# Patient Record
Sex: Female | Born: 1965
Health system: Southern US, Community
[De-identification: ages and names within clinical notes are randomized; demographics above are authoritative.]

## PROBLEM LIST (undated history)

## (undated) DIAGNOSIS — D649 Anemia, unspecified: Secondary | ICD-10-CM

## (undated) DIAGNOSIS — J479 Bronchiectasis, uncomplicated: Secondary | ICD-10-CM

## (undated) DIAGNOSIS — J45909 Unspecified asthma, uncomplicated: Secondary | ICD-10-CM

## (undated) HISTORY — DX: Unspecified asthma, uncomplicated: J45.909

## (undated) HISTORY — DX: Bronchiectasis, uncomplicated: J47.9

## (undated) HISTORY — DX: Anemia, unspecified: D64.9

---

## 2008-04-10 ENCOUNTER — Ambulatory Visit (HOSPITAL_COMMUNITY): Admission: RE | Admit: 2008-04-10 | Discharge: 2008-04-10 | Payer: Self-pay | Admitting: Family Medicine

## 2009-05-25 ENCOUNTER — Ambulatory Visit (HOSPITAL_COMMUNITY): Admission: RE | Admit: 2009-05-25 | Discharge: 2009-05-25 | Payer: Self-pay | Admitting: Family Medicine

## 2013-01-05 ENCOUNTER — Other Ambulatory Visit (HOSPITAL_COMMUNITY): Payer: Self-pay | Admitting: Obstetrics & Gynecology

## 2013-01-05 DIAGNOSIS — Z1231 Encounter for screening mammogram for malignant neoplasm of breast: Secondary | ICD-10-CM

## 2013-01-10 ENCOUNTER — Ambulatory Visit (HOSPITAL_COMMUNITY)
Admission: RE | Admit: 2013-01-10 | Discharge: 2013-01-10 | Disposition: A | Discharge status: Home / Self Care | Payer: Medicaid Other | Source: Ambulatory Visit | Attending: Obstetrics & Gynecology | Admitting: Obstetrics & Gynecology

## 2013-01-10 DIAGNOSIS — Z1231 Encounter for screening mammogram for malignant neoplasm of breast: Secondary | ICD-10-CM | POA: Insufficient documentation

## 2013-01-11 ENCOUNTER — Other Ambulatory Visit: Payer: Self-pay | Admitting: Obstetrics & Gynecology

## 2013-01-11 DIAGNOSIS — R928 Other abnormal and inconclusive findings on diagnostic imaging of breast: Secondary | ICD-10-CM

## 2013-02-02 ENCOUNTER — Ambulatory Visit
Admission: RE | Admit: 2013-02-02 | Discharge: 2013-02-02 | Disposition: A | Discharge status: Home / Self Care | Payer: Medicaid Other | Source: Ambulatory Visit | Attending: Obstetrics & Gynecology | Admitting: Obstetrics & Gynecology

## 2013-02-02 DIAGNOSIS — R928 Other abnormal and inconclusive findings on diagnostic imaging of breast: Secondary | ICD-10-CM

## 2014-02-23 ENCOUNTER — Other Ambulatory Visit: Payer: Self-pay | Admitting: Obstetrics & Gynecology

## 2014-02-23 ENCOUNTER — Encounter: Payer: Self-pay | Admitting: Obstetrics & Gynecology

## 2014-02-23 DIAGNOSIS — R921 Mammographic calcification found on diagnostic imaging of breast: Secondary | ICD-10-CM

## 2014-02-23 DIAGNOSIS — N6452 Nipple discharge: Secondary | ICD-10-CM

## 2014-03-06 ENCOUNTER — Ambulatory Visit
Admission: RE | Admit: 2014-03-06 | Discharge: 2014-03-06 | Disposition: A | Discharge status: Home / Self Care | Payer: Medicaid Other | Source: Ambulatory Visit | Attending: Obstetrics & Gynecology | Admitting: Obstetrics & Gynecology

## 2014-03-06 ENCOUNTER — Other Ambulatory Visit: Payer: Self-pay | Admitting: Obstetrics & Gynecology

## 2014-03-06 DIAGNOSIS — N6452 Nipple discharge: Secondary | ICD-10-CM

## 2014-03-06 DIAGNOSIS — R921 Mammographic calcification found on diagnostic imaging of breast: Secondary | ICD-10-CM

## 2014-03-20 ENCOUNTER — Ambulatory Visit
Admission: RE | Admit: 2014-03-20 | Discharge: 2014-03-20 | Disposition: A | Discharge status: Home / Self Care | Payer: Medicaid Other | Source: Ambulatory Visit | Attending: Obstetrics & Gynecology | Admitting: Obstetrics & Gynecology

## 2014-03-20 ENCOUNTER — Encounter (INDEPENDENT_AMBULATORY_CARE_PROVIDER_SITE_OTHER): Payer: Self-pay

## 2014-03-20 DIAGNOSIS — N6452 Nipple discharge: Secondary | ICD-10-CM

## 2014-03-20 DIAGNOSIS — R921 Mammographic calcification found on diagnostic imaging of breast: Secondary | ICD-10-CM

## 2014-03-30 ENCOUNTER — Ambulatory Visit (INDEPENDENT_AMBULATORY_CARE_PROVIDER_SITE_OTHER): Payer: Medicaid Other | Admitting: General Surgery

## 2014-05-09 ENCOUNTER — Ambulatory Visit (INDEPENDENT_AMBULATORY_CARE_PROVIDER_SITE_OTHER): Payer: Medicaid Other | Admitting: General Surgery

## 2014-05-09 ENCOUNTER — Telehealth (INDEPENDENT_AMBULATORY_CARE_PROVIDER_SITE_OTHER): Payer: Self-pay

## 2014-05-09 NOTE — Telephone Encounter (Signed)
Please call ask for glenda to reschedule with Dr. Derrell LollingIngram asap

## 2014-05-29 ENCOUNTER — Ambulatory Visit (INDEPENDENT_AMBULATORY_CARE_PROVIDER_SITE_OTHER): Payer: Self-pay | Admitting: General Surgery

## 2014-11-06 ENCOUNTER — Encounter: Payer: Self-pay | Admitting: *Deleted

## 2014-11-07 ENCOUNTER — Encounter: Payer: Self-pay | Admitting: Obstetrics & Gynecology

## 2018-04-20 ENCOUNTER — Ambulatory Visit (INDEPENDENT_AMBULATORY_CARE_PROVIDER_SITE_OTHER): Payer: BLUE CROSS/BLUE SHIELD

## 2018-04-20 ENCOUNTER — Telehealth: Payer: Self-pay | Admitting: *Deleted

## 2018-04-20 ENCOUNTER — Encounter: Payer: Self-pay | Admitting: Emergency Medicine

## 2018-04-20 ENCOUNTER — Ambulatory Visit: Payer: BLUE CROSS/BLUE SHIELD | Admitting: Emergency Medicine

## 2018-04-20 VITALS — BP 102/67 | HR 84 | Temp 98.5°F | Resp 17 | Ht 62.0 in | Wt 169.0 lb

## 2018-04-20 DIAGNOSIS — R05 Cough: Secondary | ICD-10-CM | POA: Diagnosis not present

## 2018-04-20 DIAGNOSIS — R059 Cough, unspecified: Secondary | ICD-10-CM

## 2018-04-20 DIAGNOSIS — J189 Pneumonia, unspecified organism: Secondary | ICD-10-CM | POA: Insufficient documentation

## 2018-04-20 DIAGNOSIS — J181 Lobar pneumonia, unspecified organism: Secondary | ICD-10-CM

## 2018-04-20 MED ORDER — PREDNISONE 20 MG PO TABS: 40.0000 mg | ORAL_TABLET | Freq: Every day | ORAL | 0 refills | Status: AC

## 2018-04-20 MED ORDER — AZITHROMYCIN 250 MG PO TABS: ORAL_TABLET | ORAL | 0 refills | Status: DC

## 2018-04-20 MED ORDER — CEFTRIAXONE SODIUM 1 G IJ SOLR
1.0000 g | Freq: Once | INTRAMUSCULAR | Status: AC
  Administered 2018-04-20: 1 g via INTRAMUSCULAR

## 2018-04-20 NOTE — Progress Notes (Signed)
Tamekia T Laidlaw 52 y.o.   Chief Complaint  Patient presents with  . Sore Throat  . Cough    HISTORY OF PRESENT ILLNESS: This is a 52 y.o. female complaining of productive cough for 5 days along with sore throat and chest congestion.  Denies fever or chills.  Denies difficulty breathing.  HPI   Prior to Admission medications   Not on File    No Known Allergies  There are no active problems to display for this patient.   No past medical history on file.    Social History   Socioeconomic History  . Marital status: Single    Spouse name: Not on file  . Number of children: Not on file  . Years of education: Not on file  . Highest education level: Not on file  Occupational History  . Not on file  Social Needs  . Financial resource strain: Not on file  . Food insecurity:    Worry: Not on file    Inability: Not on file  . Transportation needs:    Medical: Not on file    Non-medical: Not on file  Tobacco Use  . Smoking status: Never Smoker  . Smokeless tobacco: Never Used  Substance and Sexual Activity  . Alcohol use: Not on file  . Drug use: Not on file  . Sexual activity: Not on file  Lifestyle  . Physical activity:    Days per week: Not on file    Minutes per session: Not on file  . Stress: Not on file  Relationships  . Social connections:    Talks on phone: Not on file    Gets together: Not on file    Attends religious service: Not on file    Active member of club or organization: Not on file    Attends meetings of clubs or organizations: Not on file    Relationship status: Not on file  . Intimate partner violence:    Fear of current or ex partner: Not on file    Emotionally abused: Not on file    Physically abused: Not on file    Forced sexual activity: Not on file  Other Topics Concern  . Not on file  Social History Narrative  . Not on file    No family history on file.   Review of Systems  Constitutional: Negative.  Negative for chills  and fever.  HENT: Negative.  Negative for congestion, hearing loss, nosebleeds and sore throat.   Eyes: Negative.  Negative for blurred vision and double vision.  Respiratory: Positive for cough and sputum production. Negative for hemoptysis and shortness of breath.   Cardiovascular: Negative.  Negative for chest pain, palpitations and leg swelling.  Gastrointestinal: Negative.  Negative for abdominal pain, diarrhea, nausea and vomiting.  Genitourinary: Negative.   Musculoskeletal: Negative.  Negative for back pain, myalgias and neck pain.  Skin: Negative.  Negative for rash.  Neurological: Negative.  Negative for dizziness and headaches.  Endo/Heme/Allergies: Negative.   All other systems reviewed and are negative.   Vitals:   04/20/18 1556  BP: 102/67  Pulse: 84  Resp: 17  Temp: 98.5 F (36.9 C)  SpO2: 98%    Physical Exam  Constitutional: She is oriented to person, place, and time. She appears well-developed and well-nourished.  HENT:  Head: Normocephalic and atraumatic.  Nose: Nose normal.  Mouth/Throat: Oropharynx is clear and moist.  Eyes: Pupils are equal, round, and reactive to light. Conjunctivae and EOM are normal.  Neck: Normal range of motion. Neck supple. No JVD present.  Cardiovascular: Normal rate, regular rhythm and normal heart sounds.  Pulmonary/Chest: Effort normal. No respiratory distress. She has wheezes in the right lower field, the left middle field and the left lower field. She has rhonchi in the left middle field and the left lower field. She has rales in the left lower field.      Musculoskeletal: Normal range of motion.  Lymphadenopathy:    She has no cervical adenopathy.  Neurological: She is alert and oriented to person, place, and time. No sensory deficit. She exhibits normal muscle tone.  Skin: Skin is warm and dry. Capillary refill takes less than 2 seconds.  Psychiatric: She has a normal mood and affect. Her behavior is normal.  Vitals  reviewed. Dg Chest 2 View  Result Date: 04/20/2018 CLINICAL DATA:  Suspected pneumonia. EXAM: CHEST - 2 VIEW COMPARISON:  CT chest 12/09/2015 FINDINGS: Bilateral lower lobe bronchiectasis. Increased airspace disease at bilateral lung bases concerning for pneumonia. No pleural effusion or pneumothorax. Stable cardiomediastinal silhouette. No acute osseous abnormality. IMPRESSION: Bilateral lower lobe bronchiectasis. Increased airspace disease at bilateral lung bases concerning for pneumonia. Electronically Signed   By: Elige KoHetal  Patel   On: 04/20/2018 16:46      A total of 30 minutes was spent in the room with the patient, greater than 50% of which was in counseling/coordination of care regarding differential diagnosis, management, treatment, medications and need for follow-up in 2 3 days.   ASSESSMENT & PLAN: Jessye was seen today for sore throat and cough.  Diagnoses and all orders for this visit:  Pneumonia of both lower lobes due to infectious organism (HCC) -     cefTRIAXone (ROCEPHIN) injection 1 g -     azithromycin (ZITHROMAX) 250 MG tablet; Sig as indicated -     predniSONE (DELTASONE) 20 MG tablet; Take 2 tablets (40 mg total) by mouth daily with breakfast for 5 days.  Cough -     DG Chest 2 View; Future -     predniSONE (DELTASONE) 20 MG tablet; Take 2 tablets (40 mg total) by mouth daily with breakfast for 5 days.    Patient Instructions       IF you received an x-ray today, you will receive an invoice from [facility]. Please contact [facility] at [phone] with questions or concerns regarding your invoice.   IF you received labwork today, you will receive an invoice from [facility]. Please contact [facility] at [phone] with questions or concerns regarding your invoice.   Our billing staff will not be able to assist you with questions regarding bills from these companies.  You will be contacted with the lab results as soon as they are available.  The fastest way to get your results is to activate your My Chart account. Instructions are located on the last page of this paperwork. If you have not heard from us regarding the results in 2 weeks, please contact this office.      Vim ph?i c?ng ??ng, Ng??i l?n. Community-Acquired Pneumonia, Adult Vim ph?i l b?nh nhi?m trng ph?i. C nh?ng lo?i vim ph?i khc nhau. M?t lo?i c th? pht tri?n khi m?t ng??i ph?i n?m vi?n. M?t lo?i khc, g?i l vim ph?i c?ng ??ng, pht tri?n ? nh?ng ng??i khng, ho?c m?i ?y ch?a, ph?i n?m vi?n ho?c n?m ? c? s? ch?m [place] s?c kh?e khc. Nguyn nhn g gy ra? Vim ph?i c th? do vi khu?n, vi rt ho?c n?m  gy ra. Vim ph?i c?ng ??ng th??ng do vi khu?n gy ra Vim ph?i khu?n lin c?u. Nh?ng vi khu?n ny th??ng t? ng??i ny truy?n sang ng??i khc thng qua vi?c ht ph?i nh?ng gi?t n??c nh? do ng??i nhi?m b?nh ho ho?c h?t h?i. ?i?u g lm t?ng nguy c?? Tnh tr?ng ny hay x?y ra h?n ?:  Nh?ng ng??i b?cc b?nh m?n tnh, ch?ng h?n nh? b?nh ph?i t?c ngh?n m?n tnh (COPD), hen suy?n, suy tim sung huy?t, x? nang, ti?u ???ng, ho?c b?nh th?n.  Nh?ng ng??i m?cHIV giai ?o?n s?m ho?c giai ?o?n mu?n.  Nh?ng ng??i b?b?nh h?ng c?u hnh li?m.  Nh?ng ng??i? b? c?t lch (ph?u thu?t c?t lch).  Nh?ng ng??i cv? sinh r?ng mi?ng km.  Nh?ng ng??i ccc tnh tr?ng b?nh l lm t?ng nguy c? ht ph?i(ht ph?i) ch?t bi ti?t t? chnh mi?ng v m?i c?a h?.  Nh?ng ng??i ch? mi?n d?ch y?u (suy gi?m mi?n d?ch).  Nh?ng ng??i ht thu?c.  Nh?ng ng??i?i ??n nh?ng khu v?c m vi trng gy vim ph?i th??ng t?n t?i.  Nh?ng ng??i? bn c?nh nh?ng loi ??ng v?t c th? c vi trng gy vim ph?i, bao g?m chim, d?i, th?, mo v cc gia [place].  Cc d?u hi?u ho?c tri?u ch?ng l g? Nh?ng tri?u ch?ng c?a tnh tr?ng ny bao g?m:  [place].  Ho ??t (ho c ??m).  S?t.  ?? m? hi.  ?au ng?c, ??c bi?t khi th? su ho?c ho.  Th? nhanh ho?c kh th?.  Kh th?.  Rng mnh v  l?nh.  M?t m?i.  ?au nh?c c?.  Ch?n ?on tnh tr?ng ny nh? th? no? Chuyn gia ch?m [place] s?c kh?e c?a qu v? s? h?i v? b?nh s? v khm th?c th? cho qu v?. Qu v? c?ng c th? ph?i lm cc ki?m tra khc, bao g?m:  Ki?m tra ng?c b?ng hnh ?nh, bao g?m ch?p [place].  Nh?ng xt nghi?m ?? ki?m tra n?ng ??  xi trong mu v cc kh mu khc.  Nh?ng xt nghi?m khc v?i mu, d?ch nh?y (??m), ch?t d?ch quanh ph?i (d?ch mng ph?i), v n??c ti?u.  N?u vim ph?i n?ng, c th? c?n lm nh?ng xt nghi?m khc ?? tm nguyn nhn c? th? gy b?nh. Tnh tr?ng ny ???c ?i?u tr? nh? th? no? Lo?i ?i?u tr? dnh cho qu v? ty thu?c vo nhi?u nhn t?, ch?ng h?n nh? nguyn nhn gy vim ph?i, nh?ng lo?i thu?c qu v? dng v nh?ng tnh tr?ng b?nh l khc c?a qu v?. ??i v?i h?u h?t ng??i l?n, vi?c ?i?u tr? v ph?c h?i b?nh vim ph?i c th? di?n ra ? nh. Trong m?t s? tr??ng h?p c?n ph?i ?i?u tr? trong b?nh vi?n. ?i?u tr? c th? bao g?m:  Thu?c khng sinh, n?u vim ph?i do vi khu?n gy ra.  Thu?c ch?ng vi rt, n?u vim ph?i do vi rt gy ra.  Thu?c dng qua ???ng mi?ng ho?c qua ???ng truy?n t?nh m?ch.   xi.  Li?u php h h?p.  M?c d hi?m g?p, vi?c ?i?u tr? vim ph?i n?ng c th? bao g?m:  Thng kh c? h?c. Vi?c ny ???c th?c hi?n n?u qu v? khng t? th? ???c t?t v qu v? khng th? duy tr n?ng ??  xi an ton trong mu.  Ch?c d mng ph?i. Th? thu?t nylo?i b? d?ch ? quanh m?t bn ph?i ho?c c? hai bn ?? gip qu v? th? t?t h?n.  Tun th? nh?ng h??ng d?n ny ?  nh:  Ch? s? d?ng thu?c khng k ??n v thu?c k ??n theo ch? d?n c?a chuyn gia ch?m [place] s?c kh?e. ? Ch? dngthu?c ho n?u qu v? b? m?t ng?. Hi?u r?ng thu?c ho c th? ng?n c?n kh? n?ng lo?i b? d?ch theo cch t? nhin c?a c? th? qu v? ra kh?i ph?i. ? N?u qu v? ???c k thu?c khng sinh, hy dng thu?c theo ch? d?n c?a chuyn gia ch?m [place] s?c kh?e. Khng d?ng u?ng thu?c khng sinh ngay c? khi qu v? b?t ??u c?m th?y ?? h?n.  Ng? ? t? th? n?a  n?m n?a ng?i vo ban ?m. Th? ng? trn m?t chi?c gh? t?a, ho?c ??t vi ci g?i d??i ??u.  Khng s? d?ng cc s?n ph?m thu?c l, bao g?m thu?c l d?ng ht, thu?c l d?ng nhai v thu?c l ?i?n t?. N?u qu v? c?n gip ?? ?? cai thu?c, hy h?i chuyn gia ch?m [place] s?c kh?e.  U?ng ?? n??c ?? n??c ti?u trong ho?c c mu vng nh?t. Vi?c ny s? gip lm l?ng d?ch nh?y trong ph?i c?a qu v?. Ng?n ng?a tnh tr?ng ny b?ng cch no? C nh?ng cch m qu v? c th? gi?m nguy c? b? b?nh vim ph?i c?ng ??ng. Hy xem xt vi?c tim v?c xin ph? c?u khu?n n?u:  Qu v? trn 66 tu?i.  Qu v? h?n 19 tu?i v ?ang ?i?u tr? ung th?, c b?nh ph?i m?n tnh, ho?c c nh?ng b?nh l khc ?nh h??ng ??n h? mi?n d?ch. Hy h?i chuyn gia ch?m [place] s?c kh?e xem vi?c ny c p d?ng cho qu v? hay khng.  C nh?ng lo?i v?c xin khc nhau v cc l?ch trnh tim v?c xin ph? c?u khu?n khc nhau. Hy h?i chuyn gia ch?m [place] s?c kh?e c?a qu v? xem ph??ng n tim v?c xin no l t?t nh?t cho qu v?. Qu v? c?ng c th? ng?n ng?a vi?c b? vim ph?i c?ng ??ng n?u qu v? th?c hi?n nh?ng hnh ??ng sau:  Tim v?c xin cm hng n?m. Hy h?i chuyn gia ch?m [place] s?c kh?e c?a qu v? xem lo?i v?c xin cm no l t?t nh?t cho qu v?.  Th??ng xuyn ??n g?p nha s?.  R?a tay th??ng xuyn. S? d?ng thu?c r?a tay n?u khng c x phng v n??c.  Hy lin l?c v?i chuyn gia ch?m [place] s?c kh?e n?u:  Qu v? b? s?t.  Qu v? b? m?t ng? v qu v? khng th? ki?m sot c?n ho b?ng thu?c ho. Yu c?u tr? gip ngay l?p t?c n?u:  Qu v? b? kh th? h?n.  Qu v? b? ?au ng?c nhi?u h?n.  B?nh c?a qu v? tr? nn tr?m tr?ng h?n, ??c bi?t n?u qu v? l ng??i cao tu?i ho?c c h? mi?n d?ch y?u.  Qu v? ho ra mu. Thng tin ny khng nh?m m?c ?ch thay th? cho l?i khuyn m chuyn gia ch?m [place] s?c kh?e ni v?i qu v?. Hy b?o ??m qu v? ph?i th?o lu?n b?t k? v?n ?? g m qu v? c v?i chuyn gia ch?m  s?c kh?e c?a qu v?. Document Released: 10/27/2005 Document Revised:  02/09/2017 Document Reviewed: 02/21/2015 Elsevier Interactive Patient Education  2018 Elsevier Inc.      Edwina Barth, MD [facility]

## 2018-04-20 NOTE — Telephone Encounter (Signed)
Received call from Lumin at the [facility], the patient's pharmacy does not have Prednisone 20 mg is it okay to dispense 10 mg dosage. I told Dr Alvy BimlerSagardia and he ok 'd it.

## 2018-04-20 NOTE — Patient Instructions (Addendum)
IF you received an x-ray today, you will receive an invoice from [facility]. Please contact [facility] at [phone] with questions or concerns regarding your invoice.   IF you received labwork today, you will receive an invoice from [facility]. Please contact [facility] at [phone] with questions or concerns regarding your invoice.   Our billing staff will not be able to assist you with questions regarding bills from these companies.  You will be contacted with the lab results as soon as they are available. The fastest way to get your results is to activate your My Chart account. Instructions are located on the last page of this paperwork. If you have not heard from us regarding the results in 2 weeks, please contact this office.      Vim ph?i c?ng ??ng, Ng??i l?n. Community-Acquired Pneumonia, Adult Vim ph?i l b?nh nhi?m trng ph?i. C nh?ng lo?i vim ph?i khc nhau. M?t lo?i c th? pht tri?n khi m?t ng??i ph?i n?m vi?n. M?t lo?i khc, g?i l vim ph?i c?ng ??ng, pht tri?n ? nh?ng ng??i khng, ho?c m?i ?y ch?a, ph?i n?m vi?n ho?c n?m ? c? s? ch?m [place] s?c kh?e khc. Nguyn nhn g gy ra? Vim ph?i c th? do vi khu?n, vi rt ho?c n?m gy ra. Vim ph?i c?ng ??ng th??ng do vi khu?n gy ra Vim ph?i khu?n lin c?u. Nh?ng vi khu?n ny th??ng t? ng??i ny truy?n sang ng??i khc thng qua vi?c ht ph?i nh?ng gi?t n??c nh? do ng??i nhi?m b?nh ho ho?c h?t h?i. ?i?u g lm t?ng nguy c?? Tnh tr?ng ny hay x?y ra h?n ?:  Nh?ng ng??i b?cc b?nh m?n tnh, ch?ng h?n nh? b?nh ph?i t?c ngh?n m?n tnh (COPD), hen suy?n, suy tim sung huy?t, x? nang, ti?u ???ng, ho?c b?nh th?n.  Nh?ng ng??i m?cHIV giai ?o?n s?m ho?c giai ?o?n mu?n.  Nh?ng ng??i b?b?nh h?ng c?u hnh li?m.  Nh?ng ng??i? b? c?t lch (ph?u thu?t c?t lch).  Nh?ng ng??i cv? sinh r?ng mi?ng km.  Nh?ng ng??i ccc tnh tr?ng b?nh l lm t?ng nguy c? ht ph?i(ht ph?i) ch?t bi ti?t t? chnh mi?ng v  m?i c?a h?.  Nh?ng ng??i ch? mi?n d?ch y?u (suy gi?m mi?n d?ch).  Nh?ng ng??i ht thu?c.  Nh?ng ng??i?i ??n nh?ng khu v?c m vi trng gy vim ph?i th??ng t?n t?i.  Nh?ng ng??i? bn c?nh nh?ng loi ??ng v?t c th? c vi trng gy vim ph?i, bao g?m chim, d?i, th?, mo v cc gia [place].  Cc d?u hi?u ho?c tri?u ch?ng l g? Nh?ng tri?u ch?ng c?a tnh tr?ng ny bao g?m:  [place].  Ho ??t (ho c ??m).  S?t.  ?? m? hi.  ?au ng?c, ??c bi?t khi th? su ho?c ho.  Th? nhanh ho?c kh th?.  Kh th?.  Rng mnh v l?nh.  M?t m?i.  ?au nh?c c?.  Ch?n ?on tnh tr?ng ny nh? th? no? Chuyn gia ch?m  s?c kh?e c?a qu v? s? h?i v? b?nh s? v khm th?c th? cho qu v?. Qu v? c?ng c th? ph?i lm cc ki?m tra khc, bao g?m:  Ki?m tra ng?c b?ng hnh ?nh, bao g?m ch?p X quang.  Nh?ng xt nghi?m ?? ki?m tra n?ng ??  xi trong mu v cc kh mu khc.  Nh?ng xt nghi?m khc v?i mu, d?ch nh?y (??m), ch?t d?ch quanh ph?i (d?ch mng ph?i), v n??c ti?u.  N?u vim ph?i n?ng, c th? c?n lm nh?ng xt nghi?m khc ??  tm nguyn nhn c? th? gy b?nh. Tnh tr?ng ny ???c ?i?u tr? nh? th? no? Lo?i ?i?u tr? dnh cho qu v? ty thu?c vo nhi?u nhn t?, ch?ng h?n nh? nguyn nhn gy vim ph?i, nh?ng lo?i thu?c qu v? dng v nh?ng tnh tr?ng b?nh l khc c?a qu v?. ??i v?i h?u h?t ng??i l?n, vi?c ?i?u tr? v ph?c h?i b?nh vim ph?i c th? di?n ra ? nh. Trong m?t s? tr??ng h?p c?n ph?i ?i?u tr? trong b?nh vi?n. ?i?u tr? c th? bao g?m:  Thu?c khng sinh, n?u vim ph?i do vi khu?n gy ra.  Thu?c ch?ng vi rt, n?u vim ph?i do vi rt gy ra.  Thu?c dng qua ???ng mi?ng ho?c qua ???ng truy?n t?nh m?ch.   xi.  Li?u php h h?p.  M?c d hi?m g?p, vi?c ?i?u tr? vim ph?i n?ng c th? bao g?m:  Thng kh c? h?c. Vi?c ny ???c th?c hi?n n?u qu v? khng t? th? ???c t?t v qu v? khng th? duy tr n?ng ??  xi an ton trong mu.  Ch?c d mng ph?i. Th? thu?t nylo?i b? d?ch ? quanh m?t  bn ph?i ho?c c? hai bn ?? gip qu v? th? t?t h?n.  Tun th? nh?ng h??ng d?n ny ? nh:  Ch? s? d?ng thu?c khng k ??n v thu?c k ??n theo ch? d?n c?a chuyn gia ch?m [place] s?c kh?e. ? Ch? dngthu?c ho n?u qu v? b? m?t ng?. Hi?u r?ng thu?c ho c th? ng?n c?n kh? n?ng lo?i b? d?ch theo cch t? nhin c?a c? th? qu v? ra kh?i ph?i. ? N?u qu v? ???c k thu?c khng sinh, hy dng thu?c theo ch? d?n c?a chuyn gia ch?m [place] s?c kh?e. Khng d?ng u?ng thu?c khng sinh ngay c? khi qu v? b?t ??u c?m th?y ?? h?n.  Ng? ? t? th? n?a n?m n?a ng?i vo ban ?m. Th? ng? trn m?t chi?c gh? t?a, ho?c ??t vi ci g?i d??i ??u.  Khng s? d?ng cc s?n ph?m thu?c l, bao g?m thu?c l d?ng ht, thu?c l d?ng nhai v thu?c l ?i?n t?. N?u qu v? c?n gip ?? ?? cai thu?c, hy h?i chuyn gia ch?m [place] s?c kh?e.  U?ng ?? n??c ?? n??c ti?u trong ho?c c mu vng nh?t. Vi?c ny s? gip lm l?ng d?ch nh?y trong ph?i c?a qu v?. Ng?n ng?a tnh tr?ng ny b?ng cch no? C nh?ng cch m qu v? c th? gi?m nguy c? b? b?nh vim ph?i c?ng ??ng. Hy xem xt vi?c tim v?c xin ph? c?u khu?n n?u:  Qu v? trn 65 tu?i.  Qu v? h?n 19 tu?i v ?ang ?i?u tr? ung th?, c b?nh ph?i m?n tnh, ho?c c nh?ng b?nh l khc ?nh h??ng ??n h? mi?n d?ch. Hy h?i chuyn gia ch?m [place] s?c kh?e xem vi?c ny c p d?ng cho qu v? hay khng.  C nh?ng lo?i v?c xin khc nhau v cc l?ch trnh tim v?c xin ph? c?u khu?n khc nhau. Hy h?i chuyn gia ch?m [place] s?c kh?e c?a qu v? xem ph??ng n tim v?c xin no l t?t nh?t cho qu v?. Qu v? c?ng c th? ng?n ng?a vi?c b? vim ph?i c?ng ??ng n?u qu v? th?c hi?n nh?ng hnh ??ng sau:  Tim v?c xin cm hng n?m. Hy h?i chuyn gia ch?m [place] s?c kh?e c?a qu v? xem lo?i v?c xin cm no l t?t nh?t cho qu v?.  Th??ng xuyn ??n  g?p nha s?.  R?a tay th??ng xuyn. S? d?ng thu?c r?a tay n?u khng c x phng v n??c.  Hy lin l?c v?i chuyn gia ch?m [place] s?c kh?e n?u:  Qu v? b? s?t.  Qu v? b? m?t ng?  v qu v? khng th? ki?m sot c?n ho b?ng thu?c ho. Yu c?u tr? gip ngay l?p t?c n?u:  Qu v? b? kh th? h?n.  Qu v? b? ?au ng?c nhi?u h?n.  B?nh c?a qu v? tr? nn tr?m tr?ng h?n, ??c bi?t n?u qu v? l ng??i cao tu?i ho?c c h? mi?n d?ch y?u.  Qu v? ho ra mu. Thng tin ny khng nh?m m?c ?ch thay th? cho l?i khuyn m chuyn gia ch?m [place] s?c kh?e ni v?i qu v?. Hy b?o ??m qu v? ph?i th?o lu?n b?t k? v?n ?? g m qu v? c v?i chuyn gia ch?m [place] s?c kh?e c?a qu v?. Document Released: 10/27/2005 Document Revised: 02/09/2017 Document Reviewed: 02/21/2015 Elsevier Interactive Patient Education  2018 [employer].

## 2018-04-21 ENCOUNTER — Telehealth: Payer: Self-pay | Admitting: *Deleted

## 2018-04-21 NOTE — Telephone Encounter (Signed)
Faxed signed prescription request for Prednisone to be changed from 10 mg to 5 mg, per Dr Alvy BimlerSagardia. The medication for 10 mg is on back order. confirmation page received at 4:15 pm.

## 2018-04-27 ENCOUNTER — Encounter: Payer: Self-pay | Admitting: Emergency Medicine

## 2018-04-27 ENCOUNTER — Ambulatory Visit: Payer: BLUE CROSS/BLUE SHIELD | Admitting: Emergency Medicine

## 2018-04-27 ENCOUNTER — Telehealth: Payer: Self-pay | Admitting: *Deleted

## 2018-04-27 VITALS — BP 115/78 | HR 78 | Temp 98.7°F | Resp 17 | Ht 62.0 in | Wt 169.0 lb

## 2018-04-27 DIAGNOSIS — R05 Cough: Secondary | ICD-10-CM

## 2018-04-27 DIAGNOSIS — Z8709 Personal history of other diseases of the respiratory system: Secondary | ICD-10-CM | POA: Diagnosis not present

## 2018-04-27 DIAGNOSIS — R059 Cough, unspecified: Secondary | ICD-10-CM

## 2018-04-27 DIAGNOSIS — R062 Wheezing: Secondary | ICD-10-CM

## 2018-04-27 DIAGNOSIS — J181 Lobar pneumonia, unspecified organism: Secondary | ICD-10-CM | POA: Diagnosis not present

## 2018-04-27 DIAGNOSIS — J189 Pneumonia, unspecified organism: Secondary | ICD-10-CM

## 2018-04-27 MED ORDER — PREDNISONE 20 MG PO TABS: 40.0000 mg | ORAL_TABLET | Freq: Every day | ORAL | 0 refills | Status: DC

## 2018-04-27 MED ORDER — AMOXICILLIN-POT CLAVULANATE 875-125 MG PO TABS: 1.0000 | ORAL_TABLET | Freq: Two times a day (BID) | ORAL | 0 refills | Status: DC

## 2018-04-27 MED ORDER — PREDNISONE 20 MG PO TABS: 40.0000 mg | ORAL_TABLET | Freq: Every day | ORAL | 0 refills | Status: AC

## 2018-04-27 MED ORDER — ALBUTEROL SULFATE HFA 108 (90 BASE) MCG/ACT IN AERS: 2.0000 | INHALATION_SPRAY | Freq: Four times a day (QID) | RESPIRATORY_TRACT | 0 refills | Status: DC | PRN

## 2018-04-27 MED ORDER — AMOXICILLIN-POT CLAVULANATE 875-125 MG PO TABS: 1.0000 | ORAL_TABLET | Freq: Two times a day (BID) | ORAL | 0 refills | Status: AC

## 2018-04-27 NOTE — Telephone Encounter (Signed)
Yes, it's ok to do so.

## 2018-04-27 NOTE — Progress Notes (Signed)
Mary Boyer 52 y.o.   Chief Complaint  Patient presents with  . Follow-up    pneumonia     HISTORY OF PRESENT ILLNESS: This is a 52 y.o. female here for follow-up of pneumonia.  Seen by me 04/20/2018 and started on Zithromax after an injection of Rocephin.  Cough much improved.  Feels about 50% better.  Still has some occasional intermittent wheezing.  Review of her records show that she has a history of bronchiectasis.  Seen by pulmonary doctor about 6 months ago and CT scan of the chest revealed bilateral bronchiectasis.  Clinically doing better.  HPI   Prior to Admission medications   Medication Sig Start Date End Date Taking? Authorizing Provider  azithromycin (ZITHROMAX) 250 MG tablet Sig as indicated 04/20/18  Yes Ashauna Bertholf, Eilleen KempfMiguel Jose, MD    No Known Allergies  Patient Active Problem List   Diagnosis Date Noted  . Cough 04/20/2018  . Pneumonia of both lower lobes due to infectious organism (HCC) 04/20/2018    No past medical history on file.  No past surgical history on file.  Social History   Socioeconomic History  . Marital status: Single    Spouse name: Not on file  . Number of children: Not on file  . Years of education: Not on file  . Highest education level: Not on file  Occupational History  . Not on file  Social Needs  . Financial resource strain: Not on file  . Food insecurity:    Worry: Not on file    Inability: Not on file  . Transportation needs:    Medical: Not on file    Non-medical: Not on file  Tobacco Use  . Smoking status: Never Smoker  . Smokeless tobacco: Never Used  Substance and Sexual Activity  . Alcohol use: Not on file  . Drug use: Not on file  . Sexual activity: Not on file  Lifestyle  . Physical activity:    Days per week: Not on file    Minutes per session: Not on file  . Stress: Not on file  Relationships  . Social connections:    Talks on phone: Not on file    Gets together: Not on file    Attends religious  service: Not on file    Active member of club or organization: Not on file    Attends meetings of clubs or organizations: Not on file    Relationship status: Not on file  . Intimate partner violence:    Fear of current or ex partner: Not on file    Emotionally abused: Not on file    Physically abused: Not on file    Forced sexual activity: Not on file  Other Topics Concern  . Not on file  Social History Narrative  . Not on file    No family history on file.   Review of Systems  Constitutional: Negative.  Negative for chills and fever.  HENT: Negative.  Negative for congestion, hearing loss and sore throat.   Eyes: Negative.   Respiratory: Positive for cough and wheezing. Negative for hemoptysis and shortness of breath.   Cardiovascular: Negative.  Negative for chest pain and palpitations.  Gastrointestinal: Negative.  Negative for abdominal pain, blood in stool, diarrhea, nausea and vomiting.  Genitourinary: Negative.  Negative for dysuria and hematuria.  Musculoskeletal: Negative.  Negative for back pain, myalgias and neck pain.  Skin: Negative.  Negative for rash.  Neurological: Negative.  Negative for dizziness and headaches.  Endo/Heme/Allergies: Negative.   All other systems reviewed and are negative.   Vitals:   04/27/18 1045  BP: 115/78  Pulse: 78  Resp: 17  Temp: 98.7 F (37.1 C)  SpO2: 98%    Physical Exam  Constitutional: She is oriented to person, place, and time. She appears well-developed and well-nourished.  HENT:  Head: Normocephalic and atraumatic.  Right Ear: External ear normal.  Left Ear: External ear normal.  Nose: Nose normal.  Mouth/Throat: Oropharynx is clear and moist.  Eyes: Pupils are equal, round, and reactive to light. Conjunctivae and EOM are normal.  Neck: Normal range of motion. Neck supple. No JVD present.  Cardiovascular: Normal rate, regular rhythm and normal heart sounds.  Pulmonary/Chest: Effort normal. No respiratory distress.  She has wheezes. She has no rales.  Bilateral rhonchi  Musculoskeletal: Normal range of motion.  Lymphadenopathy:    She has no cervical adenopathy.  Neurological: She is alert and oriented to person, place, and time. No sensory deficit. She exhibits normal muscle tone.  Skin: Skin is warm and dry. Capillary refill takes less than 2 seconds.  Psychiatric: She has a normal mood and affect. Her behavior is normal.  Vitals reviewed.  Pneumonia of both lower lobes due to infectious organism [facility]) Clinically improved.  Stable vital signs and oxygen saturation.  In no apparent respiratory distress and no longer coughing here in the office.  Physical exam still reveals some bilateral wheezing.  We will continue antibiotic and start bronchodilators.  Pulmonary evaluation done 6 months ago reviewed.  CT scan of the chest then showed bilateral bronchiectasis.  No hemoptysis today.  No longer febrile.  Follow-up in 1 week.    ASSESSMENT & PLAN: Delpha was seen today for follow-up.  Diagnoses and all orders for this visit:  Pneumonia of both lower lobes due to infectious organism [facility]) Comments: Improving Orders: -     amoxicillin-clavulanate (AUGMENTIN) 875-125 MG tablet; Take 1 tablet by mouth 2 (two) times daily for 7 days.  History of bronchiectasis  Cough Comments: Much improved  Wheezing -     albuterol (PROVENTIL HFA;VENTOLIN HFA) 108 (90 Base) MCG/ACT inhaler; Inhale 2 puffs into the lungs every 6 (six) hours as needed for wheezing or shortness of breath. -     predniSONE (DELTASONE) 20 MG tablet; Take 2 tablets (40 mg total) by mouth daily with breakfast for 5 days.    Patient Instructions       IF you received an x-ray today, you will receive an invoice from [facility]. Please contact [facility] at [phone] with questions or concerns regarding your invoice.   IF you received labwork today, you will receive an invoice from [facility]. Please contact  [facility] at [phone] with questions or concerns regarding your invoice.   Our billing staff will not be able to assist you with questions regarding bills from these companies.  You will be contacted with the lab results as soon as they are available. The fastest way to get your results is to activate your My Chart account. Instructions are located on the last page of this paperwork. If you have not heard from [place] regarding the results in 2 weeks, please contact this office.      Bronchiectasis Bronchiectasis is a condition in which the airways (bronchi) are damaged and widened. This makes it difficult for the lungs to get rid of mucus. As a result, mucus gathers in the airways, and this often leads to lung infections. Infection can cause inflammation  in the airways, which may further weaken and damage the bronchi. What are the causes? Bronchiectasis may be present at birth (congenital) or may develop later in life. Sometimes there is no apparent cause. Some common causes include:  Cystic fibrosis.  Recurrent lung infections (such as pneumonia, tuberculosis, or fungal infections).  Foreign bodies or other blockages in the lungs.  Breathing in fluid, food, or other foreign objects (aspiration).  What are the signs or symptoms? Common symptoms include:  A daily cough that brings up mucus and lasts for more than 3 weeks.  Frequent lung infections (such as pneumonia, tuberculosis, or fungal infections).  Shortness of breath and wheezing.  Weakness and fatigue.  How is this diagnosed? Various tests may be done to help diagnose bronchiectasis. Tests may include:  Chest X-rays or CT scans.  Breathing tests to help determine how your lungs are working.  Sputum cultures to check for infection.  Blood tests and other tests to check for related diseases or causes, such as cystic fibrosis.  How is this treated? Treatment varies depending on the severity of the condition. Medicines  may be given to loosen the mucus to be coughed up (expectorants), to relax the muscles of the air passages (bronchodilators), or to prevent or treat infections (antibiotics). Physical therapy methods may be recommended to help clear mucus from the lungs. For severe cases, surgery may be done to remove the affected part of the lung. Follow these instructions at home:  Get plenty of rest.  Only take over-the-counter or prescription medicines as directed by your health care provider. If antibiotic medicines were prescribed, take them as directed. Finish them even if you start to feel better.  Avoid sedatives and antihistamines unless otherwise directed by your health care provider. These medicines tend to thicken the mucus in the lungs.  Perform any breathing exercises or techniques to clear the lungs as directed by your health care provider.  Drink enough fluids to keep your urine clear or pale yellow.  Consider using a cold steam vaporizer or humidifier in your room or home to help loosen secretions.  If the cough is worse at night, try sleeping in a semi-upright position in a recliner or using a couple of pillows.  Avoid cigarette smoke and lung irritants. If you smoke, quit.  Stay inside when pollution and ozone levels are high.  Stay current with vaccinations and immunizations.  Follow up with your health care provider as directed. Contact a health care provider if:  You cough up more thick, discolored mucus (sputum) that is yellow to green in color.  You have a fever or persistent symptoms for more than 2-3 days.  You cannot control your cough and are losing sleep. Get help right away if:  You cough up blood.  You have chest pain or increasing shortness of breath.  You have pain that is getting worse or is uncontrolled with medicines.  You have a fever and your symptoms suddenly get worse. This information is not intended to replace advice given to you by your health care  provider. Make sure you discuss any questions you have with your health care provider. Document Released: 08/24/2007 Document Revised: 04/09/2016 Document Reviewed: 05/04/2013 Elsevier Interactive Patient Education  2017 Elsevier Inc.      Edwina Barth, MD [facility]

## 2018-04-27 NOTE — Addendum Note (Signed)
Addended by: Georg RuddleJOYCE, Kline Bulthuis A on: 04/27/2018 12:22 PM   Modules accepted: Orders

## 2018-04-27 NOTE — Telephone Encounter (Signed)
Copied from CRM [phone]. Topic: Quick Communication - Rx Refill/Question >> Apr 27, 2018 12:52 PM Gaynelle AduPoole, Shalonda wrote: Medication: predniSONE (DELTASONE) 20 MG tablet   [facility] to see if the medication can be change to 10 mg and have the patient double up.

## 2018-04-27 NOTE — Progress Notes (Signed)
Impression IMPRESSION: The right middle and left lower lobes are collapsed and saccular bronchiectasis. Left upper lobe demonstrates saccular bronchiectasis with areas of anterior and lingular subsegmental atelectasis. Right hilar adenopathy. Mild cardiomegaly.

## 2018-04-27 NOTE — Telephone Encounter (Signed)
Called [facility] to cancel albuterol inhaler,prednisone and azithromycin. [facility] [address]  I spoke to Lauren in the pharmacy

## 2018-04-27 NOTE — Assessment & Plan Note (Signed)
Clinically improved.  Stable vital signs and oxygen saturation.  In no apparent respiratory distress and no longer coughing here in the office.  Physical exam still reveals some bilateral wheezing.  We will continue antibiotic and start bronchodilators.  Pulmonary evaluation done 6 months ago reviewed.  CT scan of the chest then showed bilateral bronchiectasis.  No hemoptysis today.  No longer febrile.  Follow-up in 1 week.

## 2018-04-27 NOTE — Patient Instructions (Addendum)
IF you received an x-ray today, you will receive an invoice from [facility]. Please contact [facility] at [phone] with questions or concerns regarding your invoice.   IF you received labwork today, you will receive an invoice from [facility]. Please contact [facility] at [phone] with questions or concerns regarding your invoice.   Our billing staff will not be able to assist you with questions regarding bills from these companies.  You will be contacted with the lab results as soon as they are available. The fastest way to get your results is to activate your My Chart account. Instructions are located on the last page of this paperwork. If you have not heard from us regarding the results in 2 weeks, please contact this office.      Bronchiectasis Bronchiectasis is a condition in which the airways (bronchi) are damaged and widened. This makes it difficult for the lungs to get rid of mucus. As a result, mucus gathers in the airways, and this often leads to lung infections. Infection can cause inflammation in the airways, which may further weaken and damage the bronchi. What are the causes? Bronchiectasis may be present at birth (congenital) or may develop later in life. Sometimes there is no apparent cause. Some common causes include:  Cystic fibrosis.  Recurrent lung infections (such as pneumonia, tuberculosis, or fungal infections).  Foreign bodies or other blockages in the lungs.  Breathing in fluid, food, or other foreign objects (aspiration).  What are the signs or symptoms? Common symptoms include:  A daily cough that brings up mucus and lasts for more than 3 weeks.  Frequent lung infections (such as pneumonia, tuberculosis, or fungal infections).  Shortness of breath and wheezing.  Weakness and fatigue.  How is this diagnosed? Various tests may be done to help diagnose bronchiectasis. Tests may include:  Chest X-rays or CT  scans.  Breathing tests to help determine how your lungs are working.  Sputum cultures to check for infection.  Blood tests and other tests to check for related diseases or causes, such as cystic fibrosis.  How is this treated? Treatment varies depending on the severity of the condition. Medicines may be given to loosen the mucus to be coughed up (expectorants), to relax the muscles of the air passages (bronchodilators), or to prevent or treat infections (antibiotics). Physical therapy methods may be recommended to help clear mucus from the lungs. For severe cases, surgery may be done to remove the affected part of the lung. Follow these instructions at home:  Get plenty of rest.  Only take over-the-counter or prescription medicines as directed by your health care provider. If antibiotic medicines were prescribed, take them as directed. Finish them even if you start to feel better.  Avoid sedatives and antihistamines unless otherwise directed by your health care provider. These medicines tend to thicken the mucus in the lungs.  Perform any breathing exercises or techniques to clear the lungs as directed by your health care provider.  Drink enough fluids to keep your urine clear or pale yellow.  Consider using a cold steam vaporizer or humidifier in your room or home to help loosen secretions.  If the cough is worse at night, try sleeping in a semi-upright position in a recliner or using a couple of pillows.  Avoid cigarette smoke and lung irritants. If you smoke, quit.  Stay inside when pollution and ozone levels are high.  Stay current with vaccinations and immunizations.  Follow up with your health care provider  a health care provider if:  You cough up more thick, discolored mucus (sputum) that is yellow to green in color.  You have a fever or persistent symptoms for more than 2-3 days.  You cannot control your cough and are losing sleep. Get help right away if:  You cough  up blood.  You have chest pain or increasing shortness of breath.  You have pain that is getting worse or is uncontrolled with medicines.  You have a fever and your symptoms suddenly get worse. This information is not intended to replace advice given to you by your health care provider. Make sure you discuss any questions you have with your health care provider. Document Released: 08/24/2007 Document Revised: 04/09/2016 Document Reviewed: 05/04/2013 Elsevier Interactive Patient Education  2017 Elsevier Inc.  

## 2018-04-29 NOTE — Telephone Encounter (Signed)
LMOVM for pharmacy [facility] with Dr. Latrelle DodrillSagardia's message OK to give 10mg  tabs and have pt take 2 per dose.

## 2018-05-04 ENCOUNTER — Other Ambulatory Visit: Payer: Self-pay

## 2018-05-04 ENCOUNTER — Encounter: Payer: Self-pay | Admitting: Emergency Medicine

## 2018-05-04 ENCOUNTER — Ambulatory Visit: Payer: BLUE CROSS/BLUE SHIELD | Admitting: Emergency Medicine

## 2018-05-04 ENCOUNTER — Ambulatory Visit (INDEPENDENT_AMBULATORY_CARE_PROVIDER_SITE_OTHER): Payer: BLUE CROSS/BLUE SHIELD

## 2018-05-04 VITALS — BP 100/62 | HR 87 | Temp 98.1°F | Resp 16 | Wt 167.2 lb

## 2018-05-04 DIAGNOSIS — J189 Pneumonia, unspecified organism: Secondary | ICD-10-CM

## 2018-05-04 DIAGNOSIS — Z1231 Encounter for screening mammogram for malignant neoplasm of breast: Secondary | ICD-10-CM

## 2018-05-04 DIAGNOSIS — J181 Lobar pneumonia, unspecified organism: Secondary | ICD-10-CM | POA: Diagnosis not present

## 2018-05-04 DIAGNOSIS — Z1239 Encounter for other screening for malignant neoplasm of breast: Secondary | ICD-10-CM

## 2018-05-04 NOTE — Progress Notes (Signed)
Mary Boyer 52 y.o.   Chief Complaint  Patient presents with  . Pneumonia    FOLLOW UP    HISTORY OF PRESENT ILLNESS: This is a 52 y.o. female  seen by me on 04/20/2018 and 04/27/2018 for pneumonia.  Patient has a history of bronchiectasis.  Compliant with medications.  Feeling a lot better.  Symptoms much improved.  Denies fever or cough.  HPI   Prior to Admission medications   Medication Sig Start Date End Date Taking? Authorizing Provider  albuterol (PROVENTIL HFA;VENTOLIN HFA) 108 (90 Base) MCG/ACT inhaler Inhale 2 puffs into the lungs every 6 (six) hours as needed for wheezing or shortness of breath. 04/27/18  Yes SagardiaEilleen Kempf, MD  amoxicillin-clavulanate (AUGMENTIN) 875-125 MG tablet Take 1 tablet by mouth 2 (two) times daily for 7 days. 04/27/18 05/04/18 Yes SagardiaEilleen Kempf, MD  azithromycin [facility]) 250 MG tablet Sig as indicated Patient not taking: Reported on 05/04/2018 04/20/18   Georgina Quint, MD    No Known Allergies  Patient Active Problem List   Diagnosis Date Noted  . History of bronchiectasis 04/27/2018  . Wheezing 04/27/2018  . Cough 04/20/2018  . Pneumonia of both lower lobes due to infectious organism (HCC) 04/20/2018    No past medical history on file.  No past surgical history on file.  Social History   Socioeconomic History  . Marital status: Single    Spouse name: Not on file  . Number of children: Not on file  . Years of education: Not on file  . Highest education level: Not on file  Occupational History  . Not on file  Social Needs  . Financial resource strain: Not on file  . Food insecurity:    Worry: Not on file    Inability: Not on file  . Transportation needs:    Medical: Not on file    Non-medical: Not on file  Tobacco Use  . Smoking status: Never Smoker  . Smokeless tobacco: Never Used  Substance and Sexual Activity  . Alcohol use: Not on file  . Drug use: Not on file  . Sexual activity: Not on file    Lifestyle  . Physical activity:    Days per week: Not on file    Minutes per session: Not on file  . Stress: Not on file  Relationships  . Social connections:    Talks on phone: Not on file    Gets together: Not on file    Attends religious service: Not on file    Active member of club or organization: Not on file    Attends meetings of clubs or organizations: Not on file    Relationship status: Not on file  . Intimate partner violence:    Fear of current or ex partner: Not on file    Emotionally abused: Not on file    Physically abused: Not on file    Forced sexual activity: Not on file  Other Topics Concern  . Not on file  Social History Narrative  . Not on file    No family history on file.   Review of Systems  Constitutional: Negative.  Negative for chills and fever.  HENT: Negative.  Negative for sore throat.   Respiratory: Negative.  Negative for cough, hemoptysis, sputum production, shortness of breath and wheezing.   Cardiovascular: Negative.  Negative for chest pain and palpitations.  Gastrointestinal: Negative.  Negative for abdominal pain, nausea and vomiting.  Skin: Negative.  Negative for rash.  Neurological: Negative.  Negative for dizziness and headaches.  Endo/Heme/Allergies: Negative.   All other systems reviewed and are negative.    Vitals:   05/04/18 1008  BP: 100/62  Pulse: 87  Resp: 16  Temp: 98.1 F (36.7 C)  SpO2: 94%     Physical Exam  Constitutional: She is oriented to person, place, and time. She appears well-nourished.  HENT:  Head: Normocephalic.  Eyes: Pupils are equal, round, and reactive to light. EOM are normal.  Neck: Normal range of motion. Neck supple.  Cardiovascular: Normal rate, regular rhythm and normal heart sounds.  Pulmonary/Chest: Effort normal. She has wheezes (Occasional scattered wheezes and rhonchi).  Musculoskeletal: Normal range of motion.  Neurological: She is alert and oriented to person, place, and time.  No sensory deficit. She exhibits normal muscle tone.  Skin: Skin is warm and dry. Capillary refill takes less than 2 seconds.  Psychiatric: She has a normal mood and affect. Her behavior is normal.  Vitals reviewed.  Dg Chest 2 View  Result Date: 05/04/2018 CLINICAL DATA:  Follow-up bibasilar changes EXAM: CHEST - 2 VIEW COMPARISON:  04/20/2018 FINDINGS: Cardiac shadow is stable. Changes of bilateral bronchiectasis are again noted stable from the previous exam. The overall degree of density hour ever has improved consisting with resolution of the previously seen acute on chronic changes. No new focal infiltrate is seen. No bony abnormality is noted. IMPRESSION: Resolution of acute on chronic changes with persistent changes of bronchiectasis. Electronically Signed   By: Alcide Clever M.D.   On: 05/04/2018 10:34     ASSESSMENT & PLAN: Mary Boyer was seen today for pneumonia.  Diagnoses and all orders for this visit:  Pneumonia of both lower lobes due to infectious organism [facility]) Comments: improved Orders: -     DG Chest 2 View; Future  Breast cancer screening -     MM Digital Screening; Future    Patient Instructions  Bronchiectasis Bronchiectasis is a condition in which the airways (bronchi) are damaged and widened. This makes it difficult for the lungs to get rid of mucus. As a result, mucus gathers in the airways, and this often leads to lung infections. Infection can cause inflammation in the airways, which may further weaken and damage the bronchi. What are the causes? Bronchiectasis may be present at birth (congenital) or may develop later in life. Sometimes there is no apparent cause. Some common causes include:  Cystic fibrosis.  Recurrent lung infections (such as pneumonia, tuberculosis, or fungal infections).  Foreign bodies or other blockages in the lungs.  Breathing in fluid, food, or other foreign objects (aspiration).  What are the signs or symptoms? Common symptoms  include:  A daily cough that brings up mucus and lasts for more than 3 weeks.  Frequent lung infections (such as pneumonia, tuberculosis, or fungal infections).  Shortness of breath and wheezing.  Weakness and fatigue.  How is this diagnosed? Various tests may be done to help diagnose bronchiectasis. Tests may include:  Chest X-rays or CT scans.  Breathing tests to help determine how your lungs are working.  Sputum cultures to check for infection.  Blood tests and other tests to check for related diseases or causes, such as cystic fibrosis.  How is this treated? Treatment varies depending on the severity of the condition. Medicines may be given to loosen the mucus to be coughed up (expectorants), to relax the muscles of the air passages (bronchodilators), or to prevent or treat infections (antibiotics). Physical therapy methods may be  recommended to help clear mucus from the lungs. For severe cases, surgery may be done to remove the affected part of the lung. Follow these instructions at home:  Get plenty of rest.  Only take over-the-counter or prescription medicines as directed by your health care provider. If antibiotic medicines were prescribed, take them as directed. Finish them even if you start to feel better.  Avoid sedatives and antihistamines unless otherwise directed by your health care provider. These medicines tend to thicken the mucus in the lungs.  Perform any breathing exercises or techniques to clear the lungs as directed by your health care provider.  Drink enough fluids to keep your urine clear or pale yellow.  Consider using a cold steam vaporizer or humidifier in your room or home to help loosen secretions.  If the cough is worse at night, try sleeping in a semi-upright position in a recliner or using a couple of pillows.  Avoid cigarette smoke and lung irritants. If you smoke, quit.  Stay inside when pollution and ozone levels are high.  Stay current  with vaccinations and immunizations.  Follow up with your health care provider as directed. Contact a health care provider if:  You cough up more thick, discolored mucus (sputum) that is yellow to green in color.  You have a fever or persistent symptoms for more than 2-3 days.  You cannot control your cough and are losing sleep. Get help right away if:  You cough up blood.  You have chest pain or increasing shortness of breath.  You have pain that is getting worse or is uncontrolled with medicines.  You have a fever and your symptoms suddenly get worse. This information is not intended to replace advice given to you by your health care provider. Make sure you discuss any questions you have with your health care provider. Document Released: 08/24/2007 Document Revised: 04/09/2016 Document Reviewed: 05/04/2013 Elsevier Interactive Patient Education  2017 Elsevier Inc.      Edwina BarthMiguel Arlissa Monteverde, MD [facility]

## 2018-05-04 NOTE — Patient Instructions (Signed)
Bronchiectasis Bronchiectasis is a condition in which the airways (bronchi) are damaged and widened. This makes it difficult for the lungs to get rid of mucus. As a result, mucus gathers in the airways, and this often leads to lung infections. Infection can cause inflammation in the airways, which may further weaken and damage the bronchi. What are the causes? Bronchiectasis may be present at birth (congenital) or may develop later in life. Sometimes there is no apparent cause. Some common causes include:  Cystic fibrosis.  Recurrent lung infections (such as pneumonia, tuberculosis, or fungal infections).  Foreign bodies or other blockages in the lungs.  Breathing in fluid, food, or other foreign objects (aspiration).  What are the signs or symptoms? Common symptoms include:  A daily cough that brings up mucus and lasts for more than 3 weeks.  Frequent lung infections (such as pneumonia, tuberculosis, or fungal infections).  Shortness of breath and wheezing.  Weakness and fatigue.  How is this diagnosed? Various tests may be done to help diagnose bronchiectasis. Tests may include:  Chest X-rays or CT scans.  Breathing tests to help determine how your lungs are working.  Sputum cultures to check for infection.  Blood tests and other tests to check for related diseases or causes, such as cystic fibrosis.  How is this treated? Treatment varies depending on the severity of the condition. Medicines may be given to loosen the mucus to be coughed up (expectorants), to relax the muscles of the air passages (bronchodilators), or to prevent or treat infections (antibiotics). Physical therapy methods may be recommended to help clear mucus from the lungs. For severe cases, surgery may be done to remove the affected part of the lung. Follow these instructions at home:  Get plenty of rest.  Only take over-the-counter or prescription medicines as directed by your health care provider. If  antibiotic medicines were prescribed, take them as directed. Finish them even if you start to feel better.  Avoid sedatives and antihistamines unless otherwise directed by your health care provider. These medicines tend to thicken the mucus in the lungs.  Perform any breathing exercises or techniques to clear the lungs as directed by your health care provider.  Drink enough fluids to keep your urine clear or pale yellow.  Consider using a cold steam vaporizer or humidifier in your room or home to help loosen secretions.  If the cough is worse at night, try sleeping in a semi-upright position in a recliner or using a couple of pillows.  Avoid cigarette smoke and lung irritants. If you smoke, quit.  Stay inside when pollution and ozone levels are high.  Stay current with vaccinations and immunizations.  Follow up with your health care provider as directed. Contact a health care provider if:  You cough up more thick, discolored mucus (sputum) that is yellow to green in color.  You have a fever or persistent symptoms for more than 2-3 days.  You cannot control your cough and are losing sleep. Get help right away if:  You cough up blood.  You have chest pain or increasing shortness of breath.  You have pain that is getting worse or is uncontrolled with medicines.  You have a fever and your symptoms suddenly get worse. This information is not intended to replace advice given to you by your health care provider. Make sure you discuss any questions you have with your health care provider. Document Released: 08/24/2007 Document Revised: 04/09/2016 Document Reviewed: 05/04/2013 Elsevier Interactive Patient Education  2017 Elsevier   Inc.  

## 2018-07-08 ENCOUNTER — Ambulatory Visit: Payer: BLUE CROSS/BLUE SHIELD | Admitting: Family Medicine

## 2018-07-08 ENCOUNTER — Encounter: Payer: Self-pay | Admitting: Family Medicine

## 2018-07-08 VITALS — BP 117/76 | HR 72 | Temp 98.7°F | Ht 61.81 in | Wt 166.8 lb

## 2018-07-08 DIAGNOSIS — L309 Dermatitis, unspecified: Secondary | ICD-10-CM | POA: Diagnosis not present

## 2018-07-08 DIAGNOSIS — Z23 Encounter for immunization: Secondary | ICD-10-CM

## 2018-07-08 DIAGNOSIS — J0110 Acute frontal sinusitis, unspecified: Secondary | ICD-10-CM | POA: Diagnosis not present

## 2018-07-08 DIAGNOSIS — R059 Cough, unspecified: Secondary | ICD-10-CM

## 2018-07-08 DIAGNOSIS — Z8709 Personal history of other diseases of the respiratory system: Secondary | ICD-10-CM | POA: Diagnosis not present

## 2018-07-08 DIAGNOSIS — R05 Cough: Secondary | ICD-10-CM

## 2018-07-08 MED ORDER — TRIAMCINOLONE ACETONIDE 0.1 % EX CREA: 1.0000 "application " | TOPICAL_CREAM | Freq: Two times a day (BID) | CUTANEOUS | 0 refills | Status: DC

## 2018-07-08 MED ORDER — DOXYCYCLINE HYCLATE 100 MG PO TABS: 100.0000 mg | ORAL_TABLET | Freq: Two times a day (BID) | ORAL | 0 refills | Status: DC

## 2018-07-08 MED ORDER — HYDROXYZINE HCL 10 MG PO TABS: 10.0000 mg | ORAL_TABLET | Freq: Three times a day (TID) | ORAL | 0 refills | Status: DC | PRN

## 2018-07-08 NOTE — Patient Instructions (Addendum)
If you have lab work done today you will be contacted with your lab results within the next 2 weeks.  If you have not heard from [place] then please contact us. The fastest way to get your results is to register for My Chart.   IF you received an x-ray today, you will receive an invoice from [facility]. Please contact [facility] at [phone] with questions or concerns regarding your invoice.   IF you received labwork today, you will receive an invoice from [facility]. Please contact [facility] at [phone] with questions or concerns regarding your invoice.   Our billing staff will not be able to assist you with questions regarding bills from these companies.  You will be contacted with the lab results as soon as they are available. The fastest way to get your results is to activate your My Chart account. Instructions are located on the last page of this paperwork. If you have not heard from [place] regarding the results in 2 weeks, please contact this office.     Vim xoang, Ng??i l?n Sinusitis, Adult Vim xoang l hi?n t??ng ?au nh?c v vim ? cc xoang m?i. Xoang la? nh??ng khoa?ng tr?ng nng trong x??ng quanh m??t quy? vi?. Cc xoang n??m ??:  Xung quanh m?t.  ?? gi??a tra?n.  Phi?a sau mu?i.  ?? x??ng go? ma? cu?a quy? vi?.  Xoang va? h?c mu?i co? n?p nh?n ch??a ch?t lo?ng ???c qua?nh (di?ch nh?y). Di?ch nh?y th???ng ti?t ra t?? xoang cu?a quy? vi?. Khi m mu?i cu?a quy? vi? bi? vim ho??c s?ng, di?ch nh?y co? th? bi? ke?t la?i ho??c t??c nn khng khi? khng th? ?i qua ca?c xoang cu?a quy? vi?. Ti?nh tra?ng na?y cho phe?p vi khu?n, vi ru?t va? n?m pha?t tri?n, ?i?u na?y d?n ??n nhi?m tru?ng. Vim xoang co? th? pha?t tri?n nhanh cho?ng va? ke?o da?i 7?10 nga?y (c?p ti?nh) ho??c h?n 12 tu?n (ma?n ti?nh). Vim xoang co? th? pha?t tri?n sau khi bi? ca?m la?nh. Nguyn nhn g gy ra? Ti?nh tra?ng na?y co? th? xa?y ra do b?t ky? nguyn nhn gi?  la?m s?ng bn trong ca?c xoang ho??c ng?n khng cho d?ch nh?y ch?y ra, bao g?m:  D? ?ng.  Hen suy?n.  Nhi?m vi khu?n ho??c vi ru?t.  Ca?c x??ng co? hi?nh da?ng b?t th???ng gi??a ca?c h?c mu?i.  Kh?i u ?? mu?i co? ch??a di?ch nh?y (polip mu?i).  L? xoang he?p.  Ca?c ch?t gy  nhi?m, ch??ng ha?n nh? ho?a ch?t ho??c cc ch?t ki?ch thi?ch trong khng khi?Marland Kitchen  M?t di? v?t t??c trong mu?i.  Nhi?m n?m. V?n ?? ny hi?m khi x?y ra.  ?i?u g lm t?ng nguy c?? Nh?ng y?u t? sau c th? lm qu v? d? b? tnh tr?ng ny h?n:  Bi? di? ??ng ho??c hen suy?n.  G?n ?y b? ca?m la?nh ho??c nhi?m trng ???ng h h?p.  Co? bi?n da?ng c?u trc ho??c t??c trong mu?i ho??c trong xoang.  C h? mi?n d?ch b? y?u.  B?i ho??c l??n nhi?u.  Du?ng qua? nhi?u thu?c xi?t mu?i.  Ht thu?c.  Cc d?u hi?u ho?c tri?u ch?ng l g? Cc tri?u ch?ng chnh cu?a ti?nh tra?ng na?y l ?au v c?m gic n?ng xung quanh cc xoang b? ?nh h??ng. Cc tri?u ch?ng khc bao g?m:  ?au r?ng hm trn.  ?au tai.  ?au ??u.  H?i th? hi.  Suy gi?m thnh gic v v? gic.  Ho co? th? nhi?u h?n va?o ban ?m.  M?t m?i.  S?t.  Ch?y di?ch ??c ?? mu?i quy? vi?. D?ch ch?y ra th??ng c mu xanh v c th? c m? (m?).  Nghe?t m?i ho?c sung huy?t.  S? mu?i pha sau. ?o? la? khi co? thm di?ch nh?y ti?ch tu? ?? ho?ng ho??c ?? thnh sau mu?i.  S?ng v ?m h?n ? cc xoang b? ?nh h??ng.  ?au h?ng.  Nh?y c?m v?i nh sng.  Ch?n ?on tnh tr?ng ny nh? th? no? Tnh tr?ng ny c th? ???c ch?n ?on d?a vo tri?u ch??ng, khai thc b?nh s? v khm th?c th?. ?? ti?m hi?u xem ti?nh tra?ng na?y la? c?p ti?nh hay ma?n ti?nh, chuyn gia ch?m so?c s??c kho?e cu?a quy? vi? co? th?:  Nhi?n va?o mu?i quy? vi? xem co? d?u hi?u polip mu?i hay khng.  G vo cc xoang b? ?nh h??ng ?? ki?m tra d?u hi?u nhi?m trng.  Khm bn trong xoang c?a qu v? b?ng cch s? d?ng m?t thi?t b? t?o ?nh c g?n ?n ? ??u (n?i  soi).  N?u chuyn gia ch?m so?c s??c kho?e cu?a quy? vi? nghi ng?? quy? vi? bi? vim xoang ma?n ti?nh, quy? vi? cu?ng co? th?:  ???c ki?m tra di? ??ng.  L?y m?u di?ch nh?y ?? mu?i (c?y di?ch nh?y mu?i) va? ki?m tra xem co? vi khu?n khng.  Cho ki?m tra m?u di?ch nh?y ?? xem b?nh vim xoang cu?a quy? vi? co? lin quan ??n di? ??ng hay khng.  N?u b?nh vim xoang cu?a quy? vi? khng ?a?p ??ng v??i ?i?u tri? va? no? ke?o da?i h?n 8 tu?n, quy? vi? co? th? ???c chu?p MRI ho??c CT ?? ki?m tra xoang cu?a quy? vi?. Vi?c chu?p na?y cu?ng giu?p xa?c ?i?nh nhi?m tru?ng cu?a quy? vi? n?ng ??n m?c na?o. Trong ca?c tr???ng h??p hi?m g??p, quy? vi? co? th? c?n la?m sinh thi?t x??ng ?? loa?i tr? nh??ng loa?i b?nh xoang do nhi?m n?m nghim tro?ng h?n. Tnh tr?ng ny ???c ?i?u tr? nh? th? no? Vi?c ?i?u tri? vim xoang tu?y thu?c va?o nguyn nhn va? ti?nh tra?ng cu?a quy? vi? la? ma?n ti?nh hay c?p ti?nh. N?u vim xoang cu?a quy? vi? do vi ru?t gy ra, ca?c tri?u ch??ng cu?a quy? vi? se? t?? h?t trong vo?ng 10 nga?y. Quy? vi? cu?ng co? th? ????c cho du?ng thu?c ?? gia?m nhe? ca?c tri?u ch??ng, bao g?m:  Thu?c ch?ng sung huy?t mu?i t?i ch?. Thu?c na?y la?m ca?c h?c mu?i s?ng co la?i va? ?? cho di?ch nh?y ch?y ra kh?i ca?c xoang.  Thu?c khng histamine. Nh??ng thu?c na?y ng?n ti?nh tra?ng vim do di? ??ng gy nn. Vi?c na?y co? th? giu?p gia?m s?ng ?? mu?i va? xoang cu?a quy? vi?.  Cc thu?c corticosteroid xi?t mu?i t?i ch?. ?y la? thu?c xi?t mu?i la?m gia?m vim va? gi?m s?ng ?? mu?i va? xoang cu?a quy? vi?.  N???c mu?i sinh l r??a mu?i. Nh??ng lo?i n???c r??a na?y co? th? giu?p loa?i bo? di?ch nh?y ???c trong mu?i quy? vi?.  N?u ti?nh tra?ng cu?a quy? vi? do vi khu?n gy ra, quy? vi? se? ????c cho du?ng thu?c kha?ng sinh. N?u ti?nh tra?ng cu?a quy? vi? do n?m gy ra, quy? vi? se? ????c cho du?ng thu?c kha?ng n?m. Ph?u thu?t co? th? c?n thi?t ?? ?i?u  tri? tnh tr?ng b?nh ly? lin quan, ch??ng ha?n nh? he?p h?c mu?i. Ph?u thu?t cu?ng co? th? c?n thi?t ?? c??t bo? polip. Tun th? nh?ng h??ng d?n ny ? nh: Thu?c  Ch? u?ng, dng ho?c bi  thu?c khng k ??n v thu?c k ??n theo ch? d?n c?a chuyn gia ch?m [place] s?c kh?e. Nh??ng thu?c na?y bao g?m ca? thu?c xi?t mu?i.  N?u qu v? ???c k thu?c khng sinh, hy dng thu?c theo ch? d?n c?a chuyn gia ch?m [place] s?c kh?e. Khng d?ng u?ng thu?c khng sinh ngay c? khi qu v? b?t ??u c?m th?y ?? h?n. U?ng ?u? n???c va? la?m ?m  U?ng ?? n??c ?? n??c ti?u trong ho?c c mu vng nh?t. Gi?? cho c? th? ?u? n???c se? giu?p la?m loa?ng di?ch nh?y.  S?? du?ng m?t ma?y ta?o s??ng mu? la?m ?m ma?t ?? gi?? ?? ?m trong nha? quy? vi? ?? m??c trn 50%.  Hi?t h?i n???c trong 10-15 phu?t, 3-4 m?i nga?y ho??c theo chi? d?n cu?a chuyn gia ch?m so?c s??c kho?e. Quy? vi? c th? la?m vi?c na?y trong pho?ng t??m khi vo?i sen n???c no?ng ?ang cha?y.  Ha?n ch? ti?p xu?c v??i khng khi? la?nh ho??c kh. Ngh? ng?i  Ngh? ng?i cng nhi?u cng t?t.  Ngu? g?i ??u cao (nng cao).  Ba?o ?a?m ngu? ?u? gi?c va?o m?i ?m. H??ng d?n chung  Ch??m kh?n ?m, ?m ln m?t 3-4 l?n m?i ngy ho?c theo ch? d?n c?a chuyn gia ch?m [place] s?c kh?e. Vi?c na?y se? giu?p gia?m c?m gic kho? chi?u.  R??a tay th???ng xuyn b??ng xa? pho?ng va? n???c ?? gia?m ti?p xu?c v??i vi ru?t va? ca?c vi tru?ng kha?c. N?u khng c x phng v n??c, hy dng thu?c st trng tay.  Khng ht thu?c. Tra?nh ?? ca?nh nh??ng ng???i hu?t thu?c (kho?i thu?c thu? ??ng).  Tun th? t?t c? cc l?n khm theo di theo ch? d?n c?a chuyn gia ch?m [place] s?c kh?e. ?i?u ny c vai tr quan tr?ng. Hy lin l?c v?i chuyn gia ch?m [place] s?c kh?e n?u:  Qu v? b? s?t.  Tri?u ch?ng c?a qu v? tr?m tr?ng h?n.  Tri?u ch?ng c?a qu v? khng c?i thi?n trong 10 ngy. Yu c?u tr? gip ngay l?p t?c n?u:  Qu v? b? ?au ??u nhi?u.  Qu v? lin t?c nn  m?a.  Qu v? b? ?au ho??c s?ng xung quanh m?t ho??c m??t.  Qu v? c v?n ?? v? th? l?c.  Qu v? b? l l?n.  C? qu v? b? c?ng.  Qu v? b? kh th?. Thng tin ny khng nh?m m?c ?ch thay th? cho l?i khuyn m chuyn gia ch?m [place] s?c kh?e ni v?i qu v?. Hy b?o ??m qu v? ph?i th?o lu?n b?t k? v?n ?? g m qu v? c v?i chuyn gia ch?m [place] s?c kh?e c?a qu v?. Document Released: 04/27/2012 Document Revised: 02/12/2017 Document Reviewed: 08/22/2015 Elsevier Interactive Patient Education  2018 [employer].

## 2018-07-08 NOTE — Progress Notes (Signed)
8/29/20199:11 AM  Mary Boyer [birth year], 52 y.o. female [id number]  Chief Complaint  Patient presents with  . Rash    on chest and legs causing lots of itching  . Headache    for the past 2 days, not taking any meds for pain  . Cough    HPI:   Patient is a 52 y.o. female  who presents today for several concerns  Having very itchy skin x 1 week, started with small bumps Legs, chest and back Scratching so much that she has caused cuts Has tried OTC anti-itch cream No new soaps, perfumes, detergents, soaps No body else in house with similar sx Had similar episode about 20 years ago - poison ivy  Having headaches at night time x 2 nights, wakes up with them around in early morning, puts a towel on forehead and drinks warm water, feels better, unable to go back to sleep Throbbing, middle of forehead, radiates backwards No nasal congestion or stuffiness, no recent illness + snore, she does not fall asleep easily during the day, no witneesed apnea Nighttime sleep of poor quality - she goes to sleep about 9pm, wakes up around 1am and then able to sleep again around 5am for couple more hours x 1 month Denies problems with anxiety and depression Has happened before No changes in vision, hearing No nausea or vomiting No focal weakness Drinks plenty of water She drinks coffee and tea, just in the morning  Coughing, recurring, treated for PNA in June Just started a couple of days ago Non productive occ SOB, no wheezing No diarrhea but having bloating, gassy, burping No h/o asthma Non smoker Seen in sept 2018 for bronchiectasis Bronch recommended but not done She reports a remote normal bronch in the past  Fall Risk  07/08/2018 05/04/2018 04/27/2018 04/20/2018  Falls in the past year? No No No No     Depression screen Mary Boyer 2/9 07/08/2018 05/04/2018 04/27/2018  Decreased Interest 0 0 0  Down, Depressed, Hopeless 0 0 0  PHQ - 2 Score 0 0 0    No Known Allergies  Prior to  Admission medications   Not on File    History reviewed. No pertinent past medical history.  History reviewed. No pertinent surgical history.  Social History   Tobacco Use  . Smoking status: Never Smoker  . Smokeless tobacco: Never Used  Substance Use Topics  . Alcohol use: Never    Frequency: Never    Family History  Problem Relation Age of Onset  . Healthy Sister   . Healthy Brother     ROS Per hpi  OBJECTIVE:  Blood pressure 117/76, pulse 72, temperature 98.7 F (37.1 C), temperature source Oral, height 5' 1.81" (1.57 m), weight 166 lb 12.8 oz (75.7 kg), last menstrual period 01/28/2013, SpO2 98 %. Body mass index is 30.69 kg/m.   Physical Exam  Constitutional: She is oriented to person, place, and time. She appears well-developed and well-nourished.  HENT:  Head: Normocephalic and atraumatic.  Right Ear: Hearing, tympanic membrane, external ear and ear canal normal.  Left Ear: Hearing, tympanic membrane, external ear and ear canal normal.  Nose: Mucosal edema and rhinorrhea present. Right sinus exhibits frontal sinus tenderness. Left sinus exhibits frontal sinus tenderness.  Mouth/Throat: Oropharynx is clear and moist.  Eyes: Pupils are equal, round, and reactive to light. Conjunctivae and EOM are normal.  Neck: Neck supple.  Cardiovascular: Normal rate, regular rhythm and normal heart sounds. Exam reveals no gallop and  no friction rub.  No murmur heard. Pulmonary/Chest: Effort normal. She has wheezes in the right lower field and the left lower field. She has rales in the right lower field and the left lower field.  Musculoskeletal: She exhibits no edema.  Lymphadenopathy:    She has no cervical adenopathy.  Neurological: She is alert and oriented to person, place, and time.  Skin: Skin is warm and dry. Rash (ascattered erytematous papules over legs, erythematous patches on chest, and back, soles and palms spared) noted.  Psychiatric: She has a normal mood and  affect.  Nursing note and vitals reviewed.   ASSESSMENT and PLAN  1. Acute non-recurrent frontal sinusitis Discussed supportive measures, new meds r/se/b and RTC precautions. Patient educational handout given.  2. History of bronchiectasis 3. Cough Referring back to pulm to complete workup - Ambulatory referral to Pulmonology  4. Dermatitis Unclear cause, treating symptomatically with triamcinolone and hydroxyzine  5. Need for influenza vaccination - Flu Vaccine QUAD 36+ mos IM  Other orders - doxycycline (VIBRA-TABS) 100 MG tablet; Take 1 tablet (100 mg total) by mouth 2 (two) times daily. - triamcinolone cream (KENALOG) 0.1 %; Apply 1 application topically 2 (two) times daily. - hydrOXYzine (ATARAX/VISTARIL) 10 MG tablet; Take 1 tablet (10 mg total) by mouth 3 (three) times daily as needed.  Return for after seeing pulmonologist.    Mary LippsIrma M Santiago, MD [facility] [address] [place], [place] [address] Ph.  [phone] Fax [phone]

## 2018-07-10 ENCOUNTER — Telehealth: Payer: Self-pay | Admitting: Emergency Medicine

## 2018-07-10 NOTE — Telephone Encounter (Signed)
Pt. Called requesting medications prescribed on 07/08/18 be swapped over too [facility] on [address]

## 2018-07-13 ENCOUNTER — Other Ambulatory Visit: Payer: Self-pay | Admitting: *Deleted

## 2018-07-13 ENCOUNTER — Telehealth: Payer: Self-pay | Admitting: *Deleted

## 2018-07-13 MED ORDER — HYDROXYZINE HCL 10 MG PO TABS: 10.0000 mg | ORAL_TABLET | Freq: Three times a day (TID) | ORAL | 0 refills | Status: DC | PRN

## 2018-07-13 MED ORDER — DOXYCYCLINE HYCLATE 100 MG PO TABS: 100.0000 mg | ORAL_TABLET | Freq: Two times a day (BID) | ORAL | 0 refills | Status: DC

## 2018-07-13 MED ORDER — TRIAMCINOLONE ACETONIDE 0.1 % EX CREA: 1.0000 "application " | TOPICAL_CREAM | Freq: Two times a day (BID) | CUTANEOUS | 0 refills | Status: AC

## 2018-07-13 NOTE — Telephone Encounter (Signed)
Patient requested medications prescribed on 07/09/2018 to be changed to [facility]. The prescriptions were sent to the pharmacy.

## 2018-07-29 ENCOUNTER — Encounter: Payer: Self-pay | Admitting: Family Medicine

## 2018-07-29 ENCOUNTER — Other Ambulatory Visit: Payer: Self-pay

## 2018-07-29 ENCOUNTER — Ambulatory Visit (INDEPENDENT_AMBULATORY_CARE_PROVIDER_SITE_OTHER): Payer: BLUE CROSS/BLUE SHIELD | Admitting: Family Medicine

## 2018-07-29 ENCOUNTER — Other Ambulatory Visit: Payer: Self-pay | Admitting: Family Medicine

## 2018-07-29 VITALS — BP 107/71 | HR 74 | Temp 97.8°F | Ht 61.61 in | Wt 163.8 lb

## 2018-07-29 DIAGNOSIS — Z6832 Body mass index (BMI) 32.0-32.9, adult: Secondary | ICD-10-CM

## 2018-07-29 DIAGNOSIS — Z01411 Encounter for gynecological examination (general) (routine) with abnormal findings: Secondary | ICD-10-CM

## 2018-07-29 DIAGNOSIS — Z1231 Encounter for screening mammogram for malignant neoplasm of breast: Secondary | ICD-10-CM | POA: Diagnosis not present

## 2018-07-29 DIAGNOSIS — Z1321 Encounter for screening for nutritional disorder: Secondary | ICD-10-CM

## 2018-07-29 DIAGNOSIS — N6452 Nipple discharge: Secondary | ICD-10-CM

## 2018-07-29 DIAGNOSIS — Z119 Encounter for screening for infectious and parasitic diseases, unspecified: Secondary | ICD-10-CM

## 2018-07-29 DIAGNOSIS — Z1211 Encounter for screening for malignant neoplasm of colon: Secondary | ICD-10-CM

## 2018-07-29 DIAGNOSIS — Z01419 Encounter for gynecological examination (general) (routine) without abnormal findings: Secondary | ICD-10-CM

## 2018-07-29 DIAGNOSIS — D649 Anemia, unspecified: Secondary | ICD-10-CM

## 2018-07-29 DIAGNOSIS — Z13228 Encounter for screening for other metabolic disorders: Secondary | ICD-10-CM

## 2018-07-29 DIAGNOSIS — Z130 Encounter for screening for diseases of the blood and blood-forming organs and certain disorders involving the immune mechanism: Secondary | ICD-10-CM

## 2018-07-29 DIAGNOSIS — Z1329 Encounter for screening for other suspected endocrine disorder: Secondary | ICD-10-CM

## 2018-07-29 LAB — HEMOCCULT GUIAC POC 1CARD (OFFICE): Fecal Occult Blood, POC: NEGATIVE

## 2018-07-29 NOTE — Patient Instructions (Addendum)
   If you have lab work done today you will be contacted with your lab results within the next 2 weeks.  If you have not heard from us then please contact us. The fastest way to get your results is to register for My Chart.   IF you received an x-ray today, you will receive an invoice from [facility]. Please contact [facility] at [phone] with questions or concerns regarding your invoice.   IF you received labwork today, you will receive an invoice from [facility]. Please contact [facility] at [phone] with questions or concerns regarding your invoice.   Our billing staff will not be able to assist you with questions regarding bills from these companies.  You will be contacted with the lab results as soon as they are available. The fastest way to get your results is to activate your My Chart account. Instructions are located on the last page of this paperwork. If you have not heard from us regarding the results in 2 weeks, please contact this office.      Preventive Care 40-64 Years, Female Preventive care refers to lifestyle choices and visits with your health care provider that can promote health and wellness. What does preventive care include?  A yearly physical exam. This is also called an annual well check.  Dental exams once or twice a year.  Routine eye exams. Ask your health care provider how often you should have your eyes checked.  Personal lifestyle choices, including: ? Daily care of your teeth and gums. ? Regular physical activity. ? Eating a healthy diet. ? Avoiding tobacco and drug use. ? Limiting alcohol use. ? Practicing safe sex. ? Taking low-dose aspirin daily starting at age 50. ? Taking vitamin and mineral supplements as recommended by your health care provider. What happens during an annual well check? The services and screenings done by your health care provider during your annual well check will depend on your age, overall health,  lifestyle risk factors, and family history of disease. Counseling Your health care provider may ask you questions about your:  Alcohol use.  Tobacco use.  Drug use.  Emotional well-being.  Home and relationship well-being.  Sexual activity.  Eating habits.  Work and work environment.  Method of birth control.  Menstrual cycle.  Pregnancy history.  Screening You may have the following tests or measurements:  Height, weight, and BMI.  Blood pressure.  Lipid and cholesterol levels. These may be checked every 5 years, or more frequently if you are over 50 years old.  Skin check.  Lung cancer screening. You may have this screening every year starting at age 55 if you have a 30-pack-year history of smoking and currently smoke or have quit within the past 15 years.  Fecal occult blood test (FOBT) of the stool. You may have this test every year starting at age 50.  Flexible sigmoidoscopy or colonoscopy. You may have a sigmoidoscopy every 5 years or a colonoscopy every 10 years starting at age 50.  Hepatitis C blood test.  Hepatitis B blood test.  Sexually transmitted disease (STD) testing.  Diabetes screening. This is done by checking your blood sugar (glucose) after you have not eaten for a while (fasting). You may have this done every 1-3 years.  Mammogram. This may be done every 1-2 years. Talk to your health care provider about when you should start having regular mammograms. This may depend on whether you have a family history of breast cancer.  BRCA-related cancer   screening. This may be done if you have a family history of breast, ovarian, tubal, or peritoneal cancers.  Pelvic exam and Pap test. This may be done every 3 years starting at age 21. Starting at age 30, this may be done every 5 years if you have a Pap test in combination with an HPV test.  Bone density scan. This is done to screen for osteoporosis. You may have this scan if you are at high risk for  osteoporosis.  Discuss your test results, treatment options, and if necessary, the need for more tests with your health care provider. Vaccines Your health care provider may recommend certain vaccines, such as:  Influenza vaccine. This is recommended every year.  Tetanus, diphtheria, and acellular pertussis (Tdap, Td) vaccine. You may need a Td booster every 10 years.  Varicella vaccine. You may need this if you have not been vaccinated.  Zoster vaccine. You may need this after age 60.  Measles, mumps, and rubella (MMR) vaccine. You may need at least one dose of MMR if you were born in 1957 or later. You may also need a second dose.  Pneumococcal 13-valent conjugate (PCV13) vaccine. You may need this if you have certain conditions and were not previously vaccinated.  Pneumococcal polysaccharide (PPSV23) vaccine. You may need one or two doses if you smoke cigarettes or if you have certain conditions.  Meningococcal vaccine. You may need this if you have certain conditions.  Hepatitis A vaccine. You may need this if you have certain conditions or if you travel or work in places where you may be exposed to hepatitis A.  Hepatitis B vaccine. You may need this if you have certain conditions or if you travel or work in places where you may be exposed to hepatitis B.  Haemophilus influenzae type b (Hib) vaccine. You may need this if you have certain conditions.  Talk to your health care provider about which screenings and vaccines you need and how often you need them. This information is not intended to replace advice given to you by your health care provider. Make sure you discuss any questions you have with your health care provider. Document Released: 11/23/2015 Document Revised: 07/16/2016 Document Reviewed: 08/28/2015 Elsevier Interactive Patient Education  2018 Elsevier Inc.  

## 2018-07-29 NOTE — Progress Notes (Signed)
9/19/20198:20 AM  Mary Boyer 29-May-1966, 52 y.o. female [id number]  Chief Complaint  Patient presents with  . Annual Exam  . Gynecologic Exam    HPI:   Patient is a 52 y.o. female with PMH of bronchiectasis who presents today for CPE with pap  Last CPE 2 years ago Cervical Cancer Screening: 2017, normal, denies h/o abnormal Menopause at age about 20 months ago, reports occasional white vaginal discharge, none today. G3P3 Breast Cancer Screening: 2016, told she needed to come back due to scant left breast nipple discharge, still has occ provoked bloody discharge, unable to return due to not having unsurance Colorectal Cancer Screening: ordered today Bone Density Testing: at age 7 HIV Screening: today STI Screening: n/a Seasonal Influenza Vaccination: 07/08/2018 Td/Tdap Vaccination: declines Pneumococcal Vaccination: at age 11 Zoster Vaccination: at pharmacy of choice Frequency of Dental evaluation: Q6 months Frequency of Eye evaluation: no, does not wear eyeglasses, has no concerns  Fall Risk  07/29/2018 07/08/2018 05/04/2018 04/27/2018 04/20/2018  Falls in the past year? No No No No No     Depression screen [facility] 2/9 07/29/2018 07/08/2018 05/04/2018  Decreased Interest 0 0 0  Down, Depressed, Hopeless 0 0 0  PHQ - 2 Score 0 0 0    No Known Allergies  Prior to Admission medications   Medication Sig Start Date End Date Taking? Authorizing Provider  triamcinolone cream (KENALOG) 0.1 % Apply 1 application topically 2 (two) times daily. 07/13/18   Horald Pollen, MD    Past Medical History:  Diagnosis Date  . Bronchiectasis ([place])     No past surgical history on file.  Social History   Tobacco Use  . Smoking status: Never Smoker  . Smokeless tobacco: Never Used  Substance Use Topics  . Alcohol use: Never    Frequency: Never    Family History  Problem Relation Age of Onset  . Healthy Sister   . Healthy Brother     Review of Systems  Constitutional:  Negative for chills and fever.  Eyes: Negative for blurred vision and double vision.  Respiratory: Positive for cough and shortness of breath.   Cardiovascular: Negative for chest pain, palpitations and leg swelling.  Gastrointestinal: Negative for abdominal pain, constipation, diarrhea, nausea and vomiting.  Genitourinary: Negative for dysuria and hematuria.  Neurological: Positive for headaches.  Psychiatric/Behavioral: The patient has insomnia.   All other systems reviewed and are negative.    OBJECTIVE:  Blood pressure 107/71, pulse 74, temperature 97.8 F (36.6 C), temperature source Oral, height 5' 1.61" (1.565 m), weight 163 lb 12.8 oz (74.3 kg), last menstrual period 01/28/2013, SpO2 96 %. Body mass index is 30.34 kg/m.    Visual Acuity Screening   Right eye Left eye Both eyes  Without correction: 20/25 20/25 20/25   With correction:      Physical Exam  Constitutional: She is oriented to person, place, and time. She appears well-developed and well-nourished.  HENT:  Head: Normocephalic and atraumatic.  Right Ear: Hearing, tympanic membrane, external ear and ear canal normal.  Left Ear: Hearing, tympanic membrane, external ear and ear canal normal.  Mouth/Throat: Oropharynx is clear and moist.  Eyes: Pupils are equal, round, and reactive to light. Conjunctivae and EOM are normal.  Neck: Neck supple. No thyromegaly present.  Cardiovascular: Normal rate, regular rhythm, normal heart sounds and intact distal pulses. Exam reveals no gallop and no friction rub.  No murmur heard. Pulmonary/Chest: Effort normal. She has no wheezes. She has rhonchi in  the right upper field, the right middle field, the right lower field, the left upper field, the left middle field and the left lower field. She has no rales. Right breast exhibits no inverted nipple, no mass, no nipple discharge, no skin change and no tenderness. Left breast exhibits nipple discharge (serous, from inner upper quadrant,  single duct). Left breast exhibits no inverted nipple, no mass, no skin change and no tenderness. Breasts are symmetrical.  Abdominal: Soft. Bowel sounds are normal. She exhibits no distension. There is no hepatosplenomegaly. There is no tenderness. There is no guarding.  Genitourinary: There is no rash or lesion on the right labia. There is no rash or lesion on the left labia. Uterus is not enlarged, not fixed and not tender. Cervix exhibits no motion tenderness, no discharge and no friability. Right adnexum displays no mass and no tenderness. Left adnexum displays no mass and no tenderness. No erythema in the vagina. No vaginal discharge found.  Musculoskeletal: Normal range of motion. She exhibits no edema.  Lymphadenopathy:    She has no cervical adenopathy.    She has no axillary adenopathy.       Right: No supraclavicular adenopathy present.       Left: No supraclavicular adenopathy present.  Neurological: She is alert and oriented to person, place, and time. She has normal reflexes.  Skin: Skin is warm and dry.  Psychiatric: She has a normal mood and affect.  Nursing note and vitals reviewed.   Results for orders placed or performed in visit on 07/29/18 (from the past 24 hour(s))  Hemoccult - 1 Card (office)     Status: None   Collection Time: 07/29/18  9:15 AM  Result Value Ref Range   Fecal Occult Blood, POC Negative Negative   Card #1 Date 07/29/18    Card #2 Fecal Occult Blod, POC     Card #2 Date     Card #3 Fecal Occult Blood, POC     Card #3 Date       ASSESSMENT and PLAN  1. Women's annual routine gynecological examination Routine HCM labs ordered. HCM reviewed/discussed. Anticipatory guidance regarding healthy weight, lifestyle and choices given.  - Pap IG w/ reflex to HPV when ASC-U  2. Discharge from left nipple - TSH - MM DIAG BREAST TOMO UNI LEFT; Future - Hemoccult - 1 Card (office) - Prolactin  3. Visit for screening mammogram - MM Digital Screening  Unilat R; Future  4. Screening for colon cancer - Ambulatory referral to Gastroenterology  5. BMI 32.0-32.9,adult Discussed importance of low carb diet, regular exercise and healthy weight.  - Lipid panel - CMP14+EGFR  6. Screening for endocrine, nutritional, metabolic and immunity disorder - Lipid panel - CMP14+EGFR - CBC with Differential/Platelet  7. Screening examination for infectious disease - HIV Antibody (routine testing w rflx) - HCV Ab w/Rflx to Verification  Return for after mammogram.    Rutherford Guys, MD Primary Care at [facility], [place] [address] Ph.  [phone] Fax [phone]

## 2018-07-30 LAB — CBC WITH DIFFERENTIAL/PLATELET
Basophils Absolute: 0.1 10*3/uL (ref 0.0–0.2)
Basos: 1 %
EOS (ABSOLUTE): 0.3 10*3/uL (ref 0.0–0.4)
Eos: 5 %
Hematocrit: 36.1 % (ref 34.0–46.6)
Hemoglobin: 10.7 g/dL — ABNORMAL LOW (ref 11.1–15.9)
Immature Grans (Abs): 0 10*3/uL (ref 0.0–0.1)
Immature Granulocytes: 0 %
Lymphocytes Absolute: 2 10*3/uL (ref 0.7–3.1)
Lymphs: 34 %
MCH: 19.9 pg — ABNORMAL LOW (ref 26.6–33.0)
MCHC: 29.6 g/dL — ABNORMAL LOW (ref 31.5–35.7)
MCV: 67 fL — ABNORMAL LOW (ref 79–97)
Monocytes Absolute: 0.4 10*3/uL (ref 0.1–0.9)
Monocytes: 7 %
Neutrophils Absolute: 3.1 10*3/uL (ref 1.4–7.0)
Neutrophils: 53 %
Platelets: 405 10*3/uL (ref 150–450)
RBC: 5.38 x10E6/uL — ABNORMAL HIGH (ref 3.77–5.28)
RDW: 17.3 % — ABNORMAL HIGH (ref 12.3–15.4)
WBC: 5.8 10*3/uL (ref 3.4–10.8)

## 2018-07-30 LAB — CMP14+EGFR
ALT: 11 IU/L (ref 0–32)
AST: 15 IU/L (ref 0–40)
Albumin/Globulin Ratio: 1.5 (ref 1.2–2.2)
Albumin: 4.5 g/dL (ref 3.5–5.5)
Alkaline Phosphatase: 59 IU/L (ref 39–117)
BUN/Creatinine Ratio: 22 (ref 9–23)
BUN: 14 mg/dL (ref 6–24)
Bilirubin Total: 0.4 mg/dL (ref 0.0–1.2)
CO2: 25 mmol/L (ref 20–29)
Calcium: 9.2 mg/dL (ref 8.7–10.2)
Chloride: 103 mmol/L (ref 96–106)
Creatinine, Ser: 0.63 mg/dL (ref 0.57–1.00)
GFR calc Af Amer: 120 mL/min/{1.73_m2} (ref >59)
GFR calc non Af Amer: 104 mL/min/{1.73_m2} (ref >59)
Globulin, Total: 3 g/dL (ref 1.5–4.5)
Glucose: 89 mg/dL (ref 65–99)
Potassium: 4.2 mmol/L (ref 3.5–5.2)
Sodium: 142 mmol/L (ref 134–144)
Total Protein: 7.5 g/dL (ref 6.0–8.5)

## 2018-07-30 LAB — LIPID PANEL
Chol/HDL Ratio: 3.4 ratio (ref 0.0–4.4)
Cholesterol, Total: 180 mg/dL (ref 100–199)
HDL: 53 mg/dL (ref >39)
LDL Calculated: 103 mg/dL — ABNORMAL HIGH (ref 0–99)
Triglycerides: 119 mg/dL (ref 0–149)
VLDL Cholesterol Cal: 24 mg/dL (ref 5–40)

## 2018-07-30 LAB — HCV INTERPRETATION

## 2018-07-30 LAB — PROLACTIN: Prolactin: 11.6 ng/mL (ref 4.8–23.3)

## 2018-07-30 LAB — TSH: TSH: 3.21 u[IU]/mL (ref 0.450–4.500)

## 2018-07-30 LAB — HIV ANTIBODY (ROUTINE TESTING W REFLEX): HIV Screen 4th Generation wRfx: NONREACTIVE

## 2018-07-30 LAB — HCV AB W/RFLX TO VERIFICATION: HCV Ab: 0.1 s/co ratio (ref 0.0–0.9)

## 2018-08-02 ENCOUNTER — Encounter: Payer: Self-pay | Admitting: Gastroenterology

## 2018-08-03 LAB — PAP IG W/ RFLX HPV ASCU: PAP Smear Comment: 0

## 2018-08-04 MED ORDER — FERROUS GLUCONATE 324 (38 FE) MG PO TABS: 324.0000 mg | ORAL_TABLET | Freq: Every day | ORAL | 1 refills | Status: AC

## 2018-08-04 NOTE — Addendum Note (Signed)
Addended by: Myles Lipps on: 08/04/2018 10:49 PM   Modules accepted: Orders

## 2018-08-05 ENCOUNTER — Encounter: Payer: Self-pay | Admitting: *Deleted

## 2018-08-13 ENCOUNTER — Telehealth: Payer: Self-pay | Admitting: Emergency Medicine

## 2018-08-13 ENCOUNTER — Other Ambulatory Visit: Payer: Self-pay

## 2018-08-13 DIAGNOSIS — N6452 Nipple discharge: Secondary | ICD-10-CM

## 2018-08-13 NOTE — Telephone Encounter (Signed)
Copied from CRM [id number]. Topic: Inquiry >> Aug 13, 2018  3:50 PM Alexander Bergeron B wrote: Reason for CRM: the breast center called to have the order co-signed, order can be signed in epic

## 2018-08-16 NOTE — Telephone Encounter (Signed)
PLEASE REVIEW AND SIGN

## 2018-08-16 NOTE — Telephone Encounter (Signed)
done

## 2018-08-16 NOTE — Telephone Encounter (Signed)
Please Advise

## 2018-08-16 NOTE — Telephone Encounter (Signed)
Is this for Dr.Santiago??

## 2018-08-17 ENCOUNTER — Ambulatory Visit
Admission: RE | Admit: 2018-08-17 | Discharge: 2018-08-17 | Disposition: A | Discharge status: Home / Self Care | Payer: Medicaid Other | Source: Ambulatory Visit | Attending: Family Medicine | Admitting: Family Medicine

## 2018-08-17 ENCOUNTER — Other Ambulatory Visit: Payer: Self-pay | Admitting: Family Medicine

## 2018-08-17 ENCOUNTER — Ambulatory Visit
Admission: RE | Admit: 2018-08-17 | Discharge: 2018-08-17 | Disposition: A | Discharge status: Home / Self Care | Payer: BLUE CROSS/BLUE SHIELD | Source: Ambulatory Visit | Attending: Family Medicine | Admitting: Family Medicine

## 2018-08-17 DIAGNOSIS — N6452 Nipple discharge: Secondary | ICD-10-CM

## 2018-08-17 DIAGNOSIS — N6320 Unspecified lump in the left breast, unspecified quadrant: Secondary | ICD-10-CM

## 2018-08-20 ENCOUNTER — Ambulatory Visit
Admission: RE | Admit: 2018-08-20 | Discharge: 2018-08-20 | Disposition: A | Discharge status: Home / Self Care | Payer: BLUE CROSS/BLUE SHIELD | Source: Ambulatory Visit | Attending: Family Medicine | Admitting: Family Medicine

## 2018-08-20 ENCOUNTER — Other Ambulatory Visit: Payer: Self-pay | Admitting: Family Medicine

## 2018-08-20 DIAGNOSIS — N6320 Unspecified lump in the left breast, unspecified quadrant: Secondary | ICD-10-CM

## 2018-08-20 DIAGNOSIS — N6452 Nipple discharge: Secondary | ICD-10-CM

## 2018-08-30 ENCOUNTER — Telehealth: Payer: Self-pay | Admitting: *Deleted

## 2018-08-30 NOTE — Telephone Encounter (Signed)
Patient no show PV today. Called patient, no answer, left message on machine to call us back to reschedule this PV. Mailed no show letter to pt. Cancelled PV and colonoscopy appointment.

## 2018-09-10 ENCOUNTER — Encounter: Payer: Self-pay | Admitting: Family Medicine

## 2018-09-10 ENCOUNTER — Other Ambulatory Visit: Payer: Self-pay | Admitting: Surgery

## 2018-09-10 DIAGNOSIS — N6452 Nipple discharge: Secondary | ICD-10-CM

## 2018-09-13 ENCOUNTER — Encounter: Payer: Self-pay | Admitting: Gastroenterology

## 2018-09-17 ENCOUNTER — Other Ambulatory Visit: Payer: BLUE CROSS/BLUE SHIELD

## 2018-09-27 ENCOUNTER — Ambulatory Visit
Admission: RE | Admit: 2018-09-27 | Discharge: 2018-09-27 | Disposition: A | Discharge status: Home / Self Care | Payer: BLUE CROSS/BLUE SHIELD | Source: Ambulatory Visit | Attending: Surgery | Admitting: Surgery

## 2018-09-27 DIAGNOSIS — N6452 Nipple discharge: Secondary | ICD-10-CM

## 2018-09-27 MED ORDER — GADOBUTROL 1 MMOL/ML IV SOLN
7.0000 mL | Freq: Once | INTRAVENOUS | Status: AC | PRN
  Administered 2018-09-27: 7 mL via INTRAVENOUS

## 2018-10-12 ENCOUNTER — Ambulatory Visit (AMBULATORY_SURGERY_CENTER): Payer: Self-pay | Admitting: *Deleted

## 2018-10-12 ENCOUNTER — Encounter: Payer: Self-pay | Admitting: Gastroenterology

## 2018-10-12 VITALS — Ht 62.0 in | Wt 168.0 lb

## 2018-10-12 DIAGNOSIS — Z1211 Encounter for screening for malignant neoplasm of colon: Secondary | ICD-10-CM

## 2018-10-12 MED ORDER — NA SULFATE-K SULFATE-MG SULF 17.5-3.13-1.6 GM/177ML PO SOLN: 1.0000 | Freq: Once | ORAL | 0 refills | Status: AC

## 2018-10-12 NOTE — Progress Notes (Signed)
No egg or soy allergy known to patient  No issues with past sedation with any surgeries  or procedures, no intubation problems  No diet pills per patient  No home 02 use per patient  No blood thinners per patient  Pt denies issues with constipation  No A fib or A flutter  EMMI video sent to pt's e mail - pt has no e mail for video  Suprep PNM $50 coupon to pt in PV  pt had interpreter present for the PV

## 2018-10-27 ENCOUNTER — Ambulatory Visit (AMBULATORY_SURGERY_CENTER): Payer: BLUE CROSS/BLUE SHIELD | Admitting: Gastroenterology

## 2018-10-27 ENCOUNTER — Encounter: Payer: Self-pay | Admitting: Gastroenterology

## 2018-10-27 VITALS — BP 126/64 | HR 59 | Temp 97.3°F | Resp 11 | Ht 61.0 in | Wt 163.0 lb

## 2018-10-27 DIAGNOSIS — Z1211 Encounter for screening for malignant neoplasm of colon: Secondary | ICD-10-CM | POA: Diagnosis not present

## 2018-10-27 MED ORDER — SODIUM CHLORIDE 0.9 % IV SOLN: 500.0000 mL | Freq: Once | INTRAVENOUS | Status: DC

## 2018-10-27 NOTE — Op Note (Signed)
[facility] Patient Name: Mary Boyer Procedure Date: 10/27/2018 1:22 PM MRN: [id number] Endoscopist: Sherilyn CooterHenry L. Myrtie Neitheranis , MD Age: [age] Referring MD:  Date of Birth: January 21, 1966 Gender: Female Account #: [id number] Procedure:                Colonoscopy Indications:              Screening for colorectal malignant neoplasm, This                            is the patient's first colonoscopy Medicines:                Monitored Anesthesia Care Procedure:                Pre-Anesthesia Assessment:                           - Prior to the procedure, a History and Physical                            was performed, and patient medications and                            allergies were reviewed. The patient's tolerance of                            previous anesthesia was also reviewed. The risks                            and benefits of the procedure and the sedation                            options and risks were discussed with the patient.                            All questions were answered, and informed consent                            was obtained. Prior Anticoagulants: The patient has                            taken no previous anticoagulant or antiplatelet                            agents. ASA Grade Assessment: II - A patient with                            mild systemic disease. After reviewing the risks                            and benefits, the patient was deemed in                            satisfactory condition to undergo the procedure.  After obtaining informed consent, the colonoscope                            was passed under direct vision. Throughout the                            procedure, the patient's blood pressure, pulse, and                            oxygen saturations were monitored continuously. The                            Model PCF-H190DL [id number]) scope was introduced                            through the anus and  advanced to the the cecum,                            identified by appendiceal orifice and ileocecal                            valve. The colonoscopy was performed without                            difficulty. The patient tolerated the procedure                            well. The quality of the bowel preparation was                            good. The ileocecal valve, appendiceal orifice, and                            rectum were photographed. Scope In: 1:29:48 PM Scope Out: 1:42:22 PM Scope Withdrawal Time: 0 hours 8 minutes 32 seconds  Total Procedure Duration: 0 hours 12 minutes 34 seconds  Findings:                 The perianal and digital rectal examinations were                            normal.                           The entire examined colon appeared normal on direct                            and retroflexion views. Complications:            No immediate complications. Estimated Blood Loss:     Estimated blood loss: none. Impression:               - The entire examined colon is normal on direct and                            retroflexion views.                           -  No specimens collected. Recommendation:           - Patient has a contact number available for                            emergencies. The signs and symptoms of potential                            delayed complications were discussed with the                            patient. Return to normal activities tomorrow.                            Written discharge instructions were provided to the                            patient.                           - Resume previous diet.                           - Continue present medications.                           - Repeat colonoscopy in 10 years for screening                            purposes. Mary Bal L. Myrtie Neither, MD 10/27/2018 1:51:32 PM This report has been signed electronically.

## 2018-10-27 NOTE — Progress Notes (Signed)
Pt awake. VSS. Report given to RN. NO anesthetic complications noted.

## 2018-10-27 NOTE — Progress Notes (Signed)
Pt's states no medical or surgical changes since previsit or office visit. Selena BattenKim was her interpreter today and verified all of her information. Sm

## 2018-10-27 NOTE — Patient Instructions (Signed)
Repeat colonoscopy in 10 years.  YOU HAD AN ENDOSCOPIC PROCEDURE TODAY AT THE  ENDOSCOPY CENTER:   Refer to the procedure report that was given to you for any specific questions about what was found during the examination.  If the procedure report does not answer your questions, please call your gastroenterologist to clarify.  If you requested that your care partner not be given the details of your procedure findings, then the procedure report has been included in a sealed envelope for you to review at your convenience later.  YOU SHOULD EXPECT: Some feelings of bloating in the abdomen. Passage of more gas than usual.  Walking can help get rid of the air that was put into your GI tract during the procedure and reduce the bloating. If you had a lower endoscopy (such as a colonoscopy or flexible sigmoidoscopy) you may notice spotting of blood in your stool or on the toilet paper. If you underwent a bowel prep for your procedure, you may not have a normal bowel movement for a few days.  Please Note:  You might notice some irritation and congestion in your nose or some drainage.  This is from the oxygen used during your procedure.  There is no need for concern and it should clear up in a day or so.  SYMPTOMS TO REPORT IMMEDIATELY:   Following lower endoscopy (colonoscopy or flexible sigmoidoscopy):  Excessive amounts of blood in the stool  Significant tenderness or worsening of abdominal pains  Swelling of the abdomen that is new, acute  Fever of 100F or higher   For urgent or emergent issues, a gastroenterologist can be reached at any hour by calling [phone].   DIET:  We do recommend a small meal at first, but then you may proceed to your regular diet.  Drink plenty of fluids but you should avoid alcoholic beverages for 24 hours.  ACTIVITY:  You should plan to take it easy for the rest of today and you should NOT DRIVE or use heavy machinery until tomorrow (because of the sedation  medicines used during the test).    FOLLOW UP: Our staff will call the number listed on your records the next business day following your procedure to check on you and address any questions or concerns that you may have regarding the information given to you following your procedure. If we do not reach you, we will leave a message.  However, if you are feeling well and you are not experiencing any problems, there is no need to return our call.  We will assume that you have returned to your regular daily activities without incident.  If any biopsies were taken you will be contacted by phone or by letter within the next 1-3 weeks.  Please call us at [phone] if you have not heard about the biopsies in 3 weeks.    SIGNATURES/CONFIDENTIALITY: You and/or your care partner have signed paperwork which will be entered into your electronic medical record.  These signatures attest to the fact that that the information above on your After Visit Summary has been reviewed and is understood.  Full responsibility of the confidentiality of this discharge information lies with you and/or your care-partner. 

## 2018-10-28 ENCOUNTER — Telehealth: Payer: Self-pay | Admitting: *Deleted

## 2018-10-28 ENCOUNTER — Telehealth: Payer: Self-pay

## 2018-10-28 NOTE — Telephone Encounter (Signed)
NO ANSWER, MESSAGE LEFT FOR PATIENT. 

## 2018-10-28 NOTE — Telephone Encounter (Signed)
Second attempt, left VM.  

## 2019-04-22 IMAGING — MR MR BILATERAL BREAST WITHOUT AND WITH CONTRAST
8 of 13 series · 33 of 48 positions shown · IV contrast (gadavist)
Comparison: Previous exam(s).

CLINICAL DATA: Biopsy proven papillary lesion, focal features
suggestive of pseudoangiomatous stromal hyperplasia in the
upper-inner quadrant of the left breast.

LABS:  None obtained at the time of imaging.
EXAM:
BILATERAL BREAST MRI WITH AND WITHOUT CONTRAST
TECHNIQUE: Multiplanar, multisequence MR images of both breasts were obtained
prior to and following the intravenous administration of 7 ml of
Gadavist.

[Series 2: t2_tirm_tra ipat (a-p) · axial · 3.0mm · 0.70mm/px · 1 of 55 slices shown]
[im 1/55]
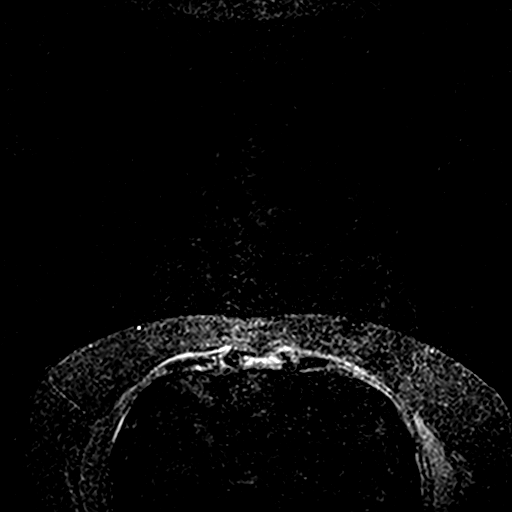

[Series 3: fl3d pre-cm no · axial · non-contrast · 1.2mm · 0.94mm/px · z∈[-67,+104]mm · 5 of 144 slices shown]
[im 1/144]
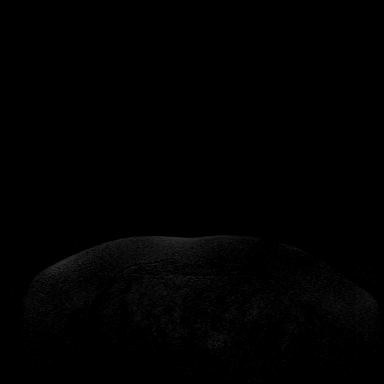
[im 36/144]
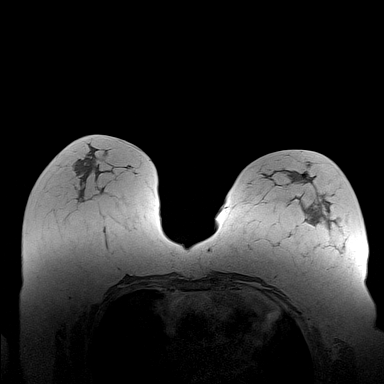
[im 72/144]
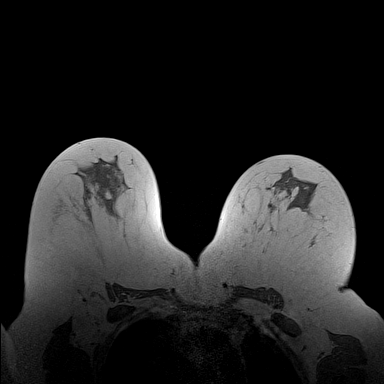
[im 108/144]
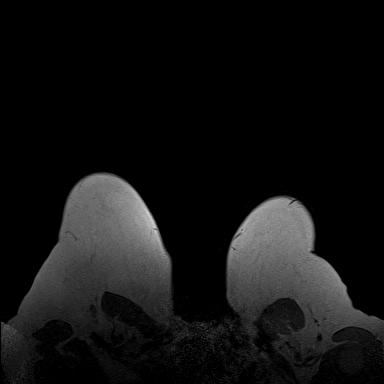
[im 144/144]
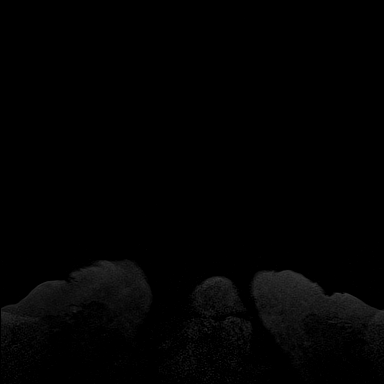

[Series 4: fl3d pre-cm · axial · non-contrast · 1.2mm · 0.94mm/px · z∈[-67,+104]mm · 5 of 144 slices shown]
[im 1/144]
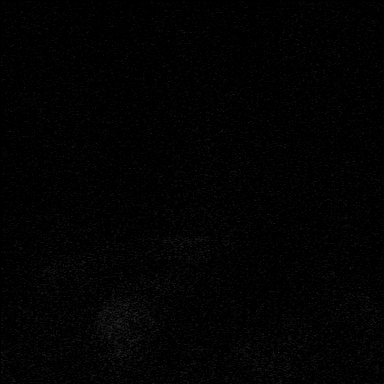
[im 36/144]
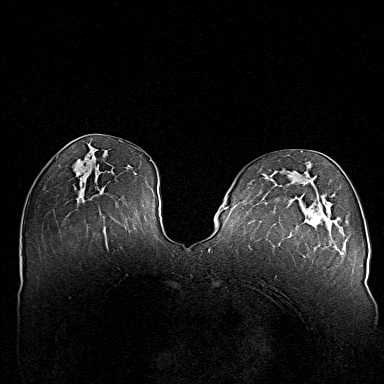
[im 72/144]
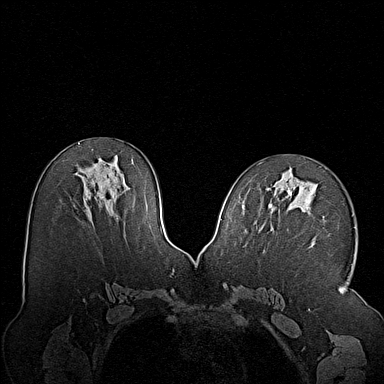
[im 108/144]
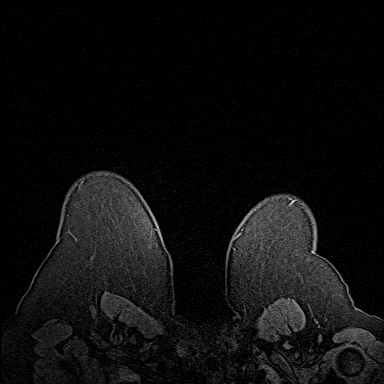
[im 144/144]
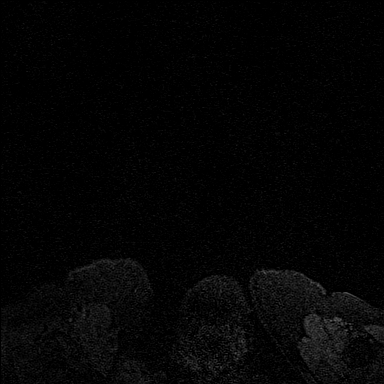

[Series 5: fl3d post immediate · axial · 1.2mm · 0.94mm/px · z∈[-67,+104]mm · 5 of 144 slices shown (1 of 3)]
[im 1/144]
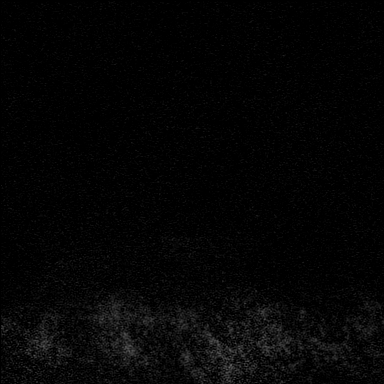
[im 36/144]
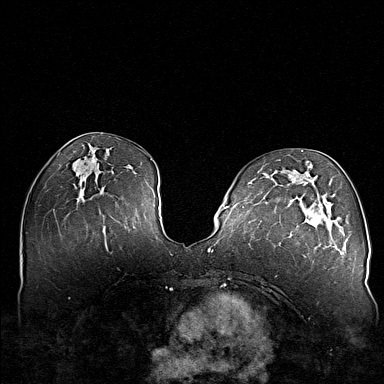
[im 72/144]
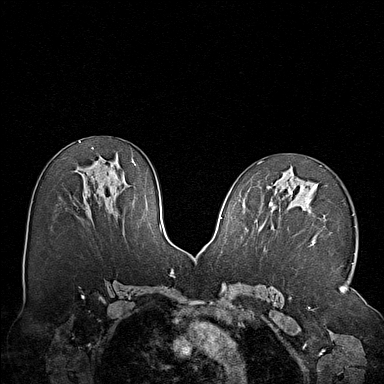
[im 108/144]
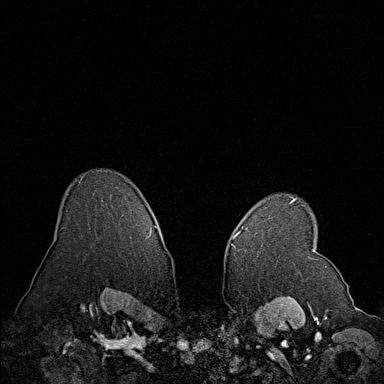
[im 144/144]
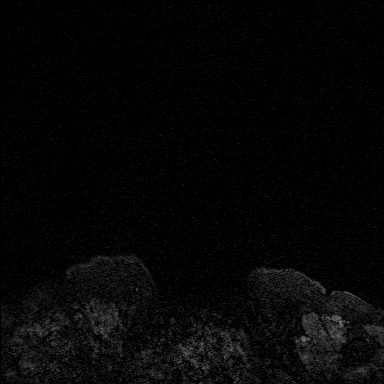

[Series 6: fl3d post immediate · axial · 1.2mm · 0.94mm/px · z∈[-67,+104]mm · 5 of 144 slices shown (2 of 3)]
[im 1/144]
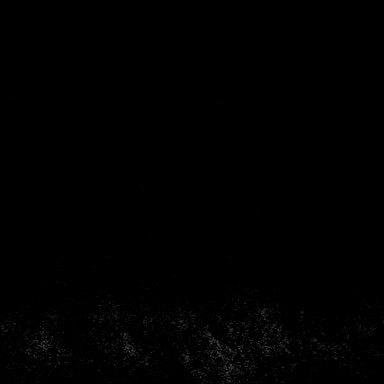
[im 36/144]
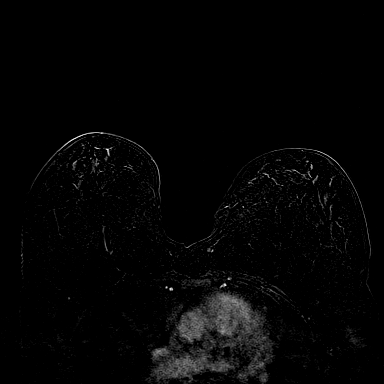
[im 72/144]
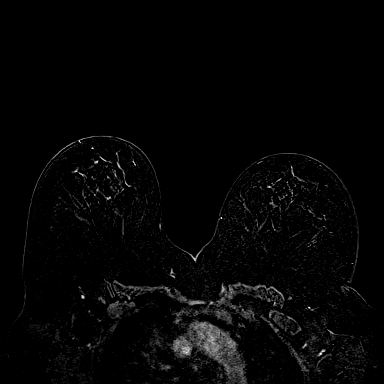
[im 108/144]
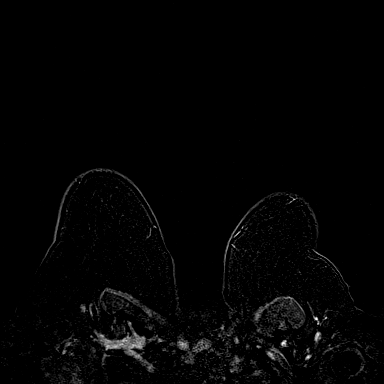
[im 144/144]
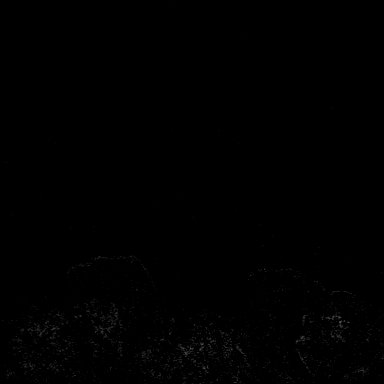

[Series 7: fl3d post immediate · axial · 172.8mm · 0.94mm/px · 1 of 1 slices shown (3 of 3)]
[im 1/1]
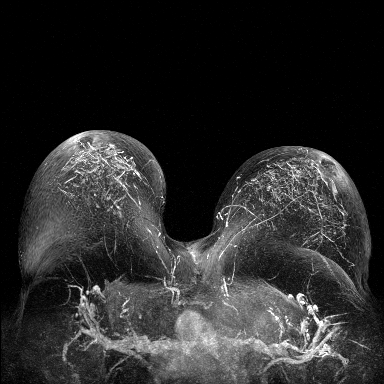

[Series 8: fl3d post 3min · axial · 1.2mm · 0.94mm/px · z∈[-67,+104]mm · 5 of 144 slices shown]
[im 1/144]
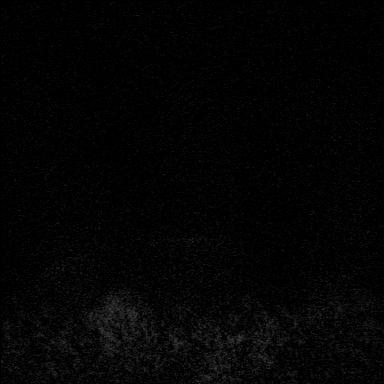
[im 36/144]
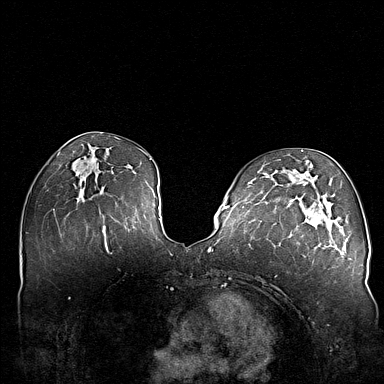
[im 72/144]
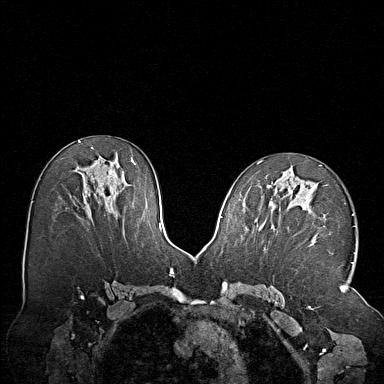
[im 108/144]
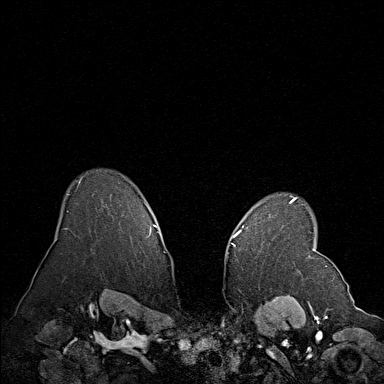
[im 144/144]
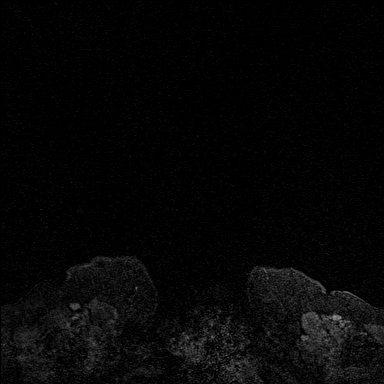

[Series 9: fl3d post 3min_sub · axial · 1.2mm · 0.94mm/px · z∈[-67,+104]mm · 6 of 144 slices shown]
[im 1/144]
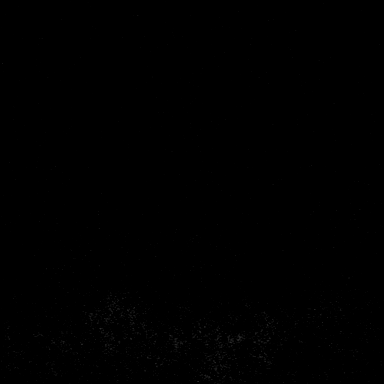
[im 29/144]
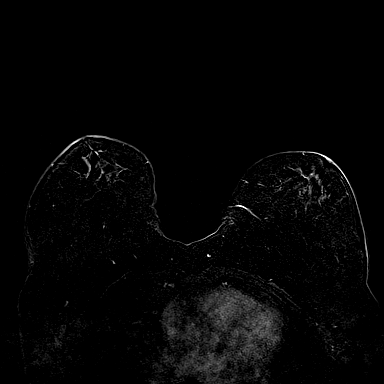
[im 58/144]
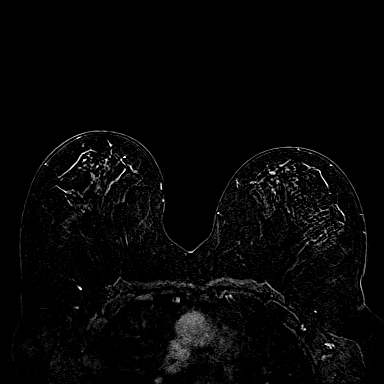
[im 86/144]
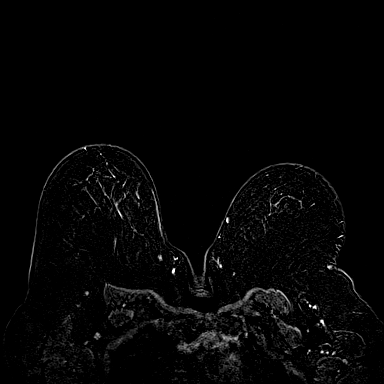
[im 115/144]
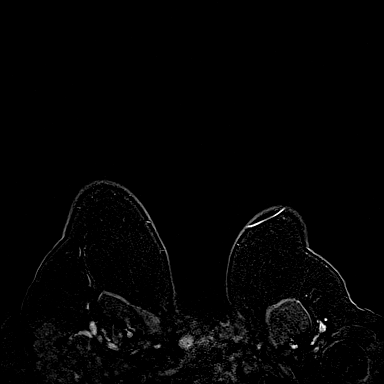
[im 144/144]
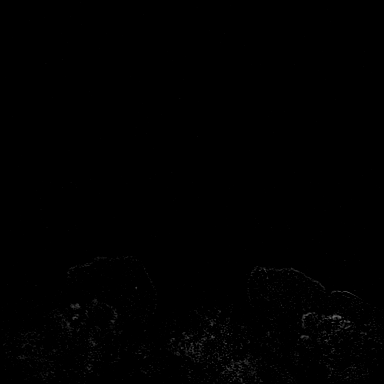

[33 of 48 positions shown; findings below may reference images not displayed]

Three-dimensional MR images were rendered by post-processing of the
original MR data on an independent workstation. The
three-dimensional MR images were interpreted, and findings are
reported in the following complete MRI report for this study. Three
dimensional images were evaluated at the independent [employer]
workstation
FINDINGS: Breast composition: c. Heterogeneous fibroglandular tissue.

Background parenchymal enhancement: Moderate.

Right breast: No mass or abnormal enhancement.

Left breast: Signal void artifact is seen in the anterior third of
the upper inner quadrant of the left breast from a biopsy clip.
There is adjacent nodular enhancement measuring 1.2 cm.

Lymph nodes: No abnormal appearing lymph nodes.

Ancillary findings:  None.
IMPRESSION: Biopsy change in the retroareolar region of the upper inner quadrant
of the left breast with 1.2 cm of nodular enhancement.

RECOMMENDATION:
Treatment planning (surgical excision) of the biopsy proven
papillary lesion in the left breast is recommended.

BI-RADS CATEGORY  4: Suspicious.

## 2020-01-19 ENCOUNTER — Ambulatory Visit: Payer: BLUE CROSS/BLUE SHIELD | Attending: Internal Medicine

## 2020-01-19 DIAGNOSIS — Z23 Encounter for immunization: Secondary | ICD-10-CM

## 2020-01-19 NOTE — Progress Notes (Signed)
   Covid-19 Vaccination Clinic  Name:  Mary Boyer    MRN: [id number] DOB: 1966-04-07  01/19/2020  Mary Boyer was observed post Covid-19 immunization for 15 minutes without incident. She was provided with Vaccine Information Sheet and instruction to access the V-Safe system.   Mary Boyer was instructed to call 911 with any severe reactions post vaccine: Marland Kitchen Difficulty breathing  . Swelling of face and throat  . A fast heartbeat  . A bad rash all over body  . Dizziness and weakness   Immunizations Administered    Name Date Dose VIS Date Route   Pfizer COVID-19 Vaccine 01/19/2020  9:29 AM 0.3 mL 10/21/2019 Intramuscular   Manufacturer: [employer], [employer]   Lot: [id number]   NDC: [address]-2

## 2020-02-13 ENCOUNTER — Ambulatory Visit: Payer: BLUE CROSS/BLUE SHIELD | Attending: Internal Medicine

## 2020-02-13 DIAGNOSIS — Z23 Encounter for immunization: Secondary | ICD-10-CM

## 2020-02-13 NOTE — Progress Notes (Signed)
   Covid-19 Vaccination Clinic  Name:  [name]    MRN: [id number] DOB: 10-26-1966  02/13/2020  [name] was observed post Covid-19 immunization for 15 minutes without incident. She was provided with Vaccine Information Sheet and instruction to access the V-Safe system.   [name] was instructed to call 911 with any severe reactions post vaccine: Marland Kitchen Difficulty breathing  . Swelling of face and throat  . A fast heartbeat  . A bad rash all over body  . Dizziness and weakness   Immunizations Administered    Name Date Dose VIS Date Route   Pfizer COVID-19 Vaccine 02/13/2020  9:13 AM 0.3 mL 10/21/2019 Intramuscular   Manufacturer: [employer], [employer]   Lot: [id number]   NDC: [address]-3

## 2020-06-08 ENCOUNTER — Other Ambulatory Visit: Payer: Self-pay

## 2020-06-08 ENCOUNTER — Ambulatory Visit (HOSPITAL_COMMUNITY)
Admission: EM | Admit: 2020-06-08 | Discharge: 2020-06-08 | Disposition: A | Discharge status: Home / Self Care | Payer: BLUE CROSS/BLUE SHIELD | Attending: Internal Medicine | Admitting: Internal Medicine

## 2020-06-08 ENCOUNTER — Encounter (HOSPITAL_COMMUNITY): Payer: Self-pay

## 2020-06-08 DIAGNOSIS — J4530 Mild persistent asthma, uncomplicated: Secondary | ICD-10-CM | POA: Diagnosis not present

## 2020-06-08 DIAGNOSIS — Z20822 Contact with and (suspected) exposure to covid-19: Secondary | ICD-10-CM | POA: Insufficient documentation

## 2020-06-08 DIAGNOSIS — R6883 Chills (without fever): Secondary | ICD-10-CM

## 2020-06-08 DIAGNOSIS — Z79899 Other long term (current) drug therapy: Secondary | ICD-10-CM | POA: Insufficient documentation

## 2020-06-08 DIAGNOSIS — J069 Acute upper respiratory infection, unspecified: Secondary | ICD-10-CM | POA: Diagnosis not present

## 2020-06-08 DIAGNOSIS — J3489 Other specified disorders of nose and nasal sinuses: Secondary | ICD-10-CM | POA: Diagnosis not present

## 2020-06-08 DIAGNOSIS — R05 Cough: Secondary | ICD-10-CM | POA: Insufficient documentation

## 2020-06-08 LAB — SARS CORONAVIRUS 2 (TAT 6-24 HRS): SARS Coronavirus 2: NEGATIVE

## 2020-06-08 MED ORDER — PROMETHAZINE-DM 6.25-15 MG/5ML PO SYRP: 5.0000 mL | ORAL_SOLUTION | Freq: Every evening | ORAL | 0 refills | Status: AC | PRN

## 2020-06-08 MED ORDER — ALBUTEROL SULFATE HFA 108 (90 BASE) MCG/ACT IN AERS: 1.0000 | INHALATION_SPRAY | Freq: Four times a day (QID) | RESPIRATORY_TRACT | 0 refills | Status: AC | PRN

## 2020-06-08 MED ORDER — PREDNISONE 20 MG PO TABS: ORAL_TABLET | ORAL | 0 refills | Status: AC

## 2020-06-08 MED ORDER — BENZONATATE 100 MG PO CAPS: 100.0000 mg | ORAL_CAPSULE | Freq: Three times a day (TID) | ORAL | 0 refills | Status: AC | PRN

## 2020-06-08 NOTE — ED Triage Notes (Signed)
Pt presents with non productive cough, congestion, and chills X 3 days with no relief from OTC medication.

## 2020-06-08 NOTE — Discharge Instructions (Addendum)

## 2020-06-08 NOTE — ED Provider Notes (Signed)
[facility]   MRN: [id number] DOB: 1966/05/07  Subjective:   Mary Boyer is a 54 y.o. female with pmh of asthma presenting for 3 day hx of malaise, ROS as below. Denies hx of COVID. Has had COVID vaccination, last dose was March 2021. Has asthma but ran out of her albuterol inhaler. She is not a smoker.   No current facility-administered medications for this encounter.  Current Outpatient Medications:    Albuterol Sulfate 108 (90 Base) MCG/ACT AEPB, Inhale into the lungs as needed., Disp: , Rfl:    ferrous gluconate (FERGON) 324 MG tablet, Take 1 tablet (324 mg total) by mouth daily with breakfast., Disp: 30 tablet, Rfl: 1   triamcinolone cream (KENALOG) 0.1 %, Apply 1 application topically 2 (two) times daily., Disp: 30 g, Rfl: 0   No Known Allergies  Past Medical History:  Diagnosis Date   Anemia    Asthma    Bronchiectasis (HCC)      Past Surgical History:  Procedure Laterality Date   CESAREAN SECTION     x1    Family History  Problem Relation Age of Onset   Healthy Sister    Healthy Brother    Breast cancer Neg Hx    Colon polyps Neg Hx    Colon cancer Neg Hx    Esophageal cancer Neg Hx    Rectal cancer Neg Hx    Stomach cancer Neg Hx     Social History   Tobacco Use   Smoking status: Never Smoker   Smokeless tobacco: Never Used  Substance Use Topics   Alcohol use: Never   Drug use: Never    Review of Systems  Constitutional: Positive for chills and malaise/fatigue. Negative for fever.  HENT: Positive for congestion. Negative for ear pain, sinus pain and sore throat.   Eyes: Negative for discharge and redness.  Respiratory: Positive for cough. Negative for hemoptysis, shortness of breath and wheezing.   Cardiovascular: Negative for chest pain.  Gastrointestinal: Negative for abdominal pain, diarrhea, nausea and vomiting.  Genitourinary: Negative for dysuria, flank pain and hematuria.  Musculoskeletal: Negative for  myalgias.  Skin: Negative for rash.  Neurological: Positive for headaches. Negative for dizziness and weakness.  Psychiatric/Behavioral: Negative for depression and substance abuse.     Objective:   Vitals: BP 116/75 (BP Location: Right Arm)    Pulse 68    Temp 99 F (37.2 C) (Oral)    Resp 17    LMP 01/28/2013    SpO2 94%   Pulse oximetry ranged between 94-96% and generally remained at 96%.   Physical Exam Constitutional:      General: She is not in acute distress.    Appearance: Normal appearance. She is well-developed. She is obese. She is not ill-appearing, toxic-appearing or diaphoretic.  HENT:     Head: Normocephalic and atraumatic.     Right Ear: Tympanic membrane and ear canal normal. No drainage or tenderness. No middle ear effusion. Tympanic membrane is not erythematous.     Left Ear: Tympanic membrane and ear canal normal. No drainage or tenderness.  No middle ear effusion. Tympanic membrane is not erythematous.     Nose: Congestion and rhinorrhea present.     Mouth/Throat:     Mouth: Mucous membranes are moist. No oral lesions.     Pharynx: No pharyngeal swelling, oropharyngeal exudate, posterior oropharyngeal erythema or uvula swelling.     Tonsils: No tonsillar exudate or tonsillar abscesses.  Eyes:     Extraocular  Movements: Extraocular movements intact.     Right eye: Normal extraocular motion.     Left eye: Normal extraocular motion.     Conjunctiva/sclera: Conjunctivae normal.     Pupils: Pupils are equal, round, and reactive to light.  Cardiovascular:     Rate and Rhythm: Normal rate and regular rhythm.     Pulses: Normal pulses.     Heart sounds: Normal heart sounds. No murmur heard.  No friction rub. No gallop.   Pulmonary:     Effort: Pulmonary effort is normal. No respiratory distress.     Breath sounds: Normal breath sounds. No stridor. No wheezing, rhonchi or rales.  Musculoskeletal:     Cervical back: Normal range of motion and neck supple.    Lymphadenopathy:     Cervical: No cervical adenopathy.  Skin:    General: Skin is warm and dry.     Findings: No rash.  Neurological:     General: No focal deficit present.     Mental Status: She is alert and oriented to person, place, and time.  Psychiatric:        Mood and Affect: Mood normal.        Behavior: Behavior normal.        Thought Content: Thought content normal.     Assessment and Plan :   PDMP not reviewed this encounter.  1. Viral URI with cough   2. Stuffy and runny nose   3. Chills   4. Mild persistent asthma without complication     Prednisone course in light of her asthma. Refilled albuterol inhaler. Will manage for viral illness such as viral URI, viral syndrome, viral rhinitis, COVID-19. Counseled patient on nature of COVID-19 including modes of transmission, diagnostic testing, management and supportive care.  Offered scripts for symptomatic relief. COVID 19 testing is pending. Counseled patient on potential for adverse effects with medications prescribed/recommended today, ER and return-to-clinic precautions discussed, patient verbalized understanding.     Wallis Bamberg, [place] 06/08/20 1219
